# Patient Record
Sex: Male | Born: 1965
Health system: Southern US, Community
[De-identification: ages and names within clinical notes are randomized; demographics above are authoritative.]

## PROBLEM LIST (undated history)

## (undated) DIAGNOSIS — F1910 Other psychoactive substance abuse, uncomplicated: Secondary | ICD-10-CM

## (undated) DIAGNOSIS — K449 Diaphragmatic hernia without obstruction or gangrene: Secondary | ICD-10-CM

## (undated) DIAGNOSIS — I10 Essential (primary) hypertension: Secondary | ICD-10-CM

## (undated) HISTORY — DX: Other psychoactive substance abuse, uncomplicated: F19.10

## (undated) HISTORY — DX: Diaphragmatic hernia without obstruction or gangrene: K44.9

## (undated) HISTORY — PX: NO PAST SURGERIES: SHX2092

---

## 2019-07-09 ENCOUNTER — Other Ambulatory Visit: Payer: Self-pay

## 2019-07-09 ENCOUNTER — Emergency Department (HOSPITAL_COMMUNITY)
Admission: EM | Admit: 2019-07-09 | Discharge: 2019-07-09 | Disposition: A | Discharge status: Home / Self Care | Payer: Self-pay | Attending: Emergency Medicine | Admitting: Emergency Medicine

## 2019-07-09 ENCOUNTER — Encounter (HOSPITAL_COMMUNITY): Payer: Self-pay

## 2019-07-09 DIAGNOSIS — M25561 Pain in right knee: Secondary | ICD-10-CM | POA: Insufficient documentation

## 2019-07-09 DIAGNOSIS — G8929 Other chronic pain: Secondary | ICD-10-CM | POA: Insufficient documentation

## 2019-07-09 DIAGNOSIS — I10 Essential (primary) hypertension: Secondary | ICD-10-CM | POA: Insufficient documentation

## 2019-07-09 HISTORY — DX: Essential (primary) hypertension: I10

## 2019-07-09 MED ORDER — MELOXICAM 7.5 MG PO TABS: 7.5000 mg | ORAL_TABLET | Freq: Every day | ORAL | 0 refills | Status: DC

## 2019-07-09 NOTE — Discharge Instructions (Addendum)
Take mobic once a day with meals.  Do not take other anti-inflammatories at the same time (Advil, Motrin, ibuprofen, naproxen, Aleve). You may supplement with Tylenol if you need further pain control. Use ice packs or heating pads if this helps control your pain. Follow up with the orthopedic doctor listed below.  Return to the ER if you develop numbness, inability to move your leg, fevers and redness, or any new, worsening, or concerning symptoms.

## 2019-07-09 NOTE — ED Triage Notes (Signed)
Pt reports tearing a ligament in his right leg about 2 months ago. Pt reports he was given oxycodone for pain but cannot take that anymore because he is in a rehab program. Pt states that he is new to this area and really just needs a ortho follow-up.

## 2019-07-09 NOTE — ED Provider Notes (Signed)
[facility] DEPT Provider Note   CSN: [id number] Arrival date & time: 07/09/19  1239     History Chief Complaint  Patient presents with  . Leg Pain    Fernando Cook is a 53 y.o. male presenting for evaluation of right knee pain.  Patient states that 4 months ago he tore a ligament in his knee.  Since then, he has had persistent pain.  He was being managed by an orthopedic doctor in [place].  He was on OxyContin, however recently moved to [facility] for rehab program.  He is not allowed to be on narcotics.  He thus has not been taking anything for pain.  He is requesting information for orthopedic follow-up as well as further pain control.  He denies numbness or tingling.  He denies fevers, chills, redness, or swelling.  He has a history of high blood pressure for which he takes medication, no other medical problems.    HPI     Past Medical History:  Diagnosis Date  . Hypertension     There are no problems to display for this patient.   History reviewed. No pertinent surgical history.     History reviewed. No pertinent family history.  Social History   Tobacco Use  . Smoking status: Not on file  Substance Use Topics  . Alcohol use: Not on file  . Drug use: Not on file    Home Medications Prior to Admission medications   Medication Sig Start Date End Date Taking? Authorizing Provider  meloxicam (MOBIC) 7.5 MG tablet Take 1 tablet (7.5 mg total) by mouth daily. 07/09/19   Marvia Troost, PA-C    Allergies    Patient has no allergy information on record.  Review of Systems   Review of Systems  Musculoskeletal: Positive for arthralgias.  Neurological: Negative for numbness.    Physical Exam Updated Vital Signs BP (!) 181/103 (BP Location: Left Arm)   Pulse 60   Temp 98.3 F (36.8 C) (Oral)   Resp 18   Ht 5\' 7"  (1.702 m)   Wt 96.6 kg   SpO2 100%   BMI 33.36 kg/m   Physical Exam Vitals and nursing note reviewed.   Constitutional:      General: He is not in acute distress.    Appearance: He is well-developed.     Comments: Sitting comfortably in the bed in no acute distress  HENT:     Head: Normocephalic and atraumatic.  Pulmonary:     Effort: Pulmonary effort is normal.  Abdominal:     General: There is no distension.  Musculoskeletal:        General: No swelling or tenderness. Normal range of motion.     Cervical back: Normal range of motion.     Comments: No erythema, warmth, or obvious deformity of the right knee.  No tenderness palpation over the joint line.  Full active range of motion of the knee without difficulty.  Good distal sensation.  Pedal pulses intact.  Skin:    General: Skin is warm.     Capillary Refill: Capillary refill takes less than 2 seconds.     Findings: No rash.  Neurological:     Mental Status: He is alert and oriented to person, place, and time.     ED Results / Procedures / Treatments   Labs (all labs ordered are listed, but only abnormal results are displayed) Labs Reviewed - No data to display  EKG None  Radiology No results found.  Procedures Procedures (including critical care time)  Medications Ordered in ED Medications - No data to display  ED Course  I have reviewed the triage vital signs and the nursing notes.  Pertinent labs & imaging results that were available during my care of the patient were reviewed by me and considered in my medical decision making (see chart for details).    MDM Rules/Calculators/A&P                      Patient presenting for evaluation of knee pain.  He is requesting information for follow-up with orthopedics.  No sign of deformity, no joint line tenderness.  I do not believe he needs repeat x-rays today.  Discussed symptomatic treatment with NSAIDs, will give Mobic for pain control.  Encouraged ice as needed.  Patient given information for follow-up with orthopedics.  At this time, patient appears safe for  discharge.  Return precautions given.  Patient states he understands and agrees to plan.  Final Clinical Impression(s) / ED Diagnoses Final diagnoses:  Chronic pain of right knee    Rx / DC Orders ED Discharge Orders         Ordered    meloxicam (MOBIC) 7.5 MG tablet  Daily     07/09/19 1313           [place], PA-C 07/09/19 1351    Charlesetta Shanks, MD 07/10/19 1243

## 2019-07-15 ENCOUNTER — Encounter (HOSPITAL_COMMUNITY): Payer: Self-pay

## 2019-07-15 ENCOUNTER — Emergency Department (HOSPITAL_COMMUNITY)
Admission: EM | Admit: 2019-07-15 | Discharge: 2019-07-15 | Disposition: A | Discharge status: Home / Self Care | Payer: Self-pay | Attending: Emergency Medicine | Admitting: Emergency Medicine

## 2019-07-15 ENCOUNTER — Other Ambulatory Visit: Payer: Self-pay

## 2019-07-15 DIAGNOSIS — I10 Essential (primary) hypertension: Secondary | ICD-10-CM | POA: Insufficient documentation

## 2019-07-15 DIAGNOSIS — R1013 Epigastric pain: Secondary | ICD-10-CM | POA: Insufficient documentation

## 2019-07-15 DIAGNOSIS — Z760 Encounter for issue of repeat prescription: Secondary | ICD-10-CM | POA: Insufficient documentation

## 2019-07-15 MED ORDER — FAMOTIDINE 20 MG PO TABS
20.0000 mg | ORAL_TABLET | Freq: Once | ORAL | Status: AC
  Administered 2019-07-15: 20 mg via ORAL
  Filled 2019-07-15: qty 1

## 2019-07-15 MED ORDER — ALUM & MAG HYDROXIDE-SIMETH 200-200-20 MG/5ML PO SUSP
30.0000 mL | Freq: Once | ORAL | Status: AC
  Administered 2019-07-15: 30 mL via ORAL
  Filled 2019-07-15: qty 30

## 2019-07-15 MED ORDER — OMEPRAZOLE 20 MG PO CPDR: 20.0000 mg | DELAYED_RELEASE_CAPSULE | Freq: Every day | ORAL | 1 refills | Status: DC

## 2019-07-15 MED ORDER — LIDOCAINE VISCOUS HCL 2 % MT SOLN
15.0000 mL | Freq: Once | OROMUCOSAL | Status: AC
  Administered 2019-07-15: 15 mL via ORAL
  Filled 2019-07-15: qty 15

## 2019-07-15 MED ORDER — FAMOTIDINE 20 MG PO TABS: 20.0000 mg | ORAL_TABLET | Freq: Two times a day (BID) | ORAL | 0 refills | Status: DC

## 2019-07-15 NOTE — Discharge Instructions (Signed)
It is very important that you follow-up with one of her gastroenterologist.  You may have ulcers or other conditions in your stomach causing his persistent symptoms.  You need to see a specialist for this, who can perform procedure to look at your stomach with a camera.  In the meantime, try to follow the food diet provided in the information.  Try to avoid coffee, soda, very spicy food or hot sauce.

## 2019-07-15 NOTE — ED Provider Notes (Signed)
[facility] DEPT Provider Note   CSN: [id number] Arrival date & time: 07/15/19  1011     History Chief Complaint  Patient presents with  . Abdominal Pain  . Medication Refill    Fernando Cook is a 53 y.o. male the history of chronic epigastric pain and reflux presenting to the ED with recurrent epigastric pain.  He reports he has had the exact same pain for 10 years please getting worse past several days.  Says he recently moved from [place] and ran out of his prescription of omeprazole, and needs a refill.  He denies any vomiting.  He says pain is burning and located directly in his epigastrium.  Does not radiate anywhere.  It is 9 out of 10 right now.  Does not have any diarrhea.  He has no history of abdominal surgeries.  No fevers or chills.  HPI     Past Medical History:  Diagnosis Date  . Hypertension     There are no problems to display for this patient.   History reviewed. No pertinent surgical history.     History reviewed. No pertinent family history.  Social History   Tobacco Use  . Smoking status: Not on file  Substance Use Topics  . Alcohol use: Not on file  . Drug use: Not on file    Home Medications Prior to Admission medications   Medication Sig Start Date End Date Taking? Authorizing Provider  famotidine (PEPCID) 20 MG tablet Take 1 tablet (20 mg total) by mouth 2 (two) times daily. 07/15/19 08/14/19  Wyvonnia Dusky, MD  meloxicam (MOBIC) 7.5 MG tablet Take 1 tablet (7.5 mg total) by mouth daily. 07/09/19   Caccavale, Sophia, PA-C  omeprazole (PRILOSEC) 20 MG capsule Take 1 capsule (20 mg total) by mouth daily. 07/15/19 11/12/19  Wyvonnia Dusky, MD    Allergies    Patient has no known allergies.  Review of Systems   Review of Systems  Constitutional: Negative for chills and fever.  Respiratory: Negative for cough and shortness of breath.   Cardiovascular: Negative for chest pain and palpitations.   Gastrointestinal: Positive for abdominal pain and nausea. Negative for constipation, diarrhea and vomiting.  Musculoskeletal: Negative for arthralgias and back pain.  Skin: Negative for pallor and rash.  Neurological: Negative for syncope and light-headedness.  All other systems reviewed and are negative.   Physical Exam Updated Vital Signs BP (!) 141/85   Pulse 74   Temp 98 F (36.7 C) (Oral)   Resp 15   SpO2 97%   Physical Exam Vitals and nursing note reviewed.  Constitutional:      Appearance: He is well-developed.  HENT:     Head: Normocephalic and atraumatic.  Eyes:     Conjunctiva/sclera: Conjunctivae normal.  Cardiovascular:     Rate and Rhythm: Normal rate and regular rhythm.     Heart sounds: No murmur.  Pulmonary:     Effort: Pulmonary effort is normal. No respiratory distress.     Breath sounds: Normal breath sounds.  Abdominal:     General: There is no distension.     Palpations: Abdomen is soft.     Tenderness: There is abdominal tenderness (mild) in the epigastric area. There is no right CVA tenderness, left CVA tenderness, guarding or rebound. Negative signs include Murphy's sign and McBurney's sign.  Musculoskeletal:     Cervical back: Neck supple.  Skin:    General: Skin is warm and dry.  Neurological:  General: No focal deficit present.     Mental Status: He is alert and oriented to person, place, and time.     ED Results / Procedures / Treatments   Labs (all labs ordered are listed, but only abnormal results are displayed) Labs Reviewed - No data to display  EKG None  Radiology No results found.  Procedures Procedures (including critical care time)  Medications Ordered in ED Medications  alum & mag hydroxide-simeth (MAALOX/MYLANTA) 200-200-20 MG/5ML suspension 30 mL (30 mLs Oral Given 07/15/19 1044)    And  lidocaine (XYLOCAINE) 2 % viscous mouth solution 15 mL (15 mLs Oral Given 07/15/19 1044)  famotidine (PEPCID) tablet 20 mg  (20 mg Oral Given 07/15/19 1043)    ED Course  I have reviewed the triage vital signs and the nursing notes.  Pertinent labs & imaging results that were available during my care of the patient were reviewed by me and considered in my medical decision making (see chart for details).  53 year old male presents to the ED with very mild epigastric pain and discomfort and burning sensation.  Has a history of reflux.  May have peptic ulcer disease.  Very low suspicion for perforation.  He has benign abdominal exam no distention.  He ran out of his medications will have to refill these.  Also explained he needs to follow-up with a gastroenterologist, he is new to the area but says he will try to do so.  We will give him a GI cocktail here.  I also very low suspicion for biliary disease.  He has no fever no right upper quadrant tenderness and negative Murphy sign.  Likewise of a low suspicion for pancreatitis.  He reports he has had no recent alcohol consumption or binge drinking.  No prior history of pancreatitis.  Likewise have a low suspicion this is an atypical cardiac syndrome, given that the patient tells me has had the same kind of pain in his epigastrium for over 10 years.  Clinical Course as of Jul 14 1456  Fri Jul 15, 2019  1109 Pain resolved after GI cocktail   [MT]    Clinical Course User Index [MT] Langston Masker Carola Rhine, MD     Final Clinical Impression(s) / ED Diagnoses Final diagnoses:  Epigastric pain    Rx / DC Orders ED Discharge Orders         Ordered    famotidine (PEPCID) 20 MG tablet  2 times daily     07/15/19 1104    omeprazole (PRILOSEC) 20 MG capsule  Daily     07/15/19 1104           Wyvonnia Dusky, MD 07/15/19 1457

## 2019-07-15 NOTE — ED Notes (Signed)
Pt ambulatory from triage 

## 2019-07-15 NOTE — ED Triage Notes (Signed)
Pt c/o ulcers and epigastric pain. Pt states he recently ran out of omeprezole .

## 2019-07-18 ENCOUNTER — Encounter: Payer: Self-pay | Admitting: Nurse Practitioner

## 2019-08-05 ENCOUNTER — Ambulatory Visit (INDEPENDENT_AMBULATORY_CARE_PROVIDER_SITE_OTHER): Payer: Self-pay | Admitting: Nurse Practitioner

## 2019-08-05 ENCOUNTER — Encounter: Payer: Self-pay | Admitting: Nurse Practitioner

## 2019-08-05 ENCOUNTER — Other Ambulatory Visit (INDEPENDENT_AMBULATORY_CARE_PROVIDER_SITE_OTHER): Payer: Self-pay

## 2019-08-05 VITALS — BP 122/86 | HR 74 | Temp 98.1°F | Ht 67.0 in | Wt 239.2 lb

## 2019-08-05 DIAGNOSIS — G8929 Other chronic pain: Secondary | ICD-10-CM

## 2019-08-05 DIAGNOSIS — Z1211 Encounter for screening for malignant neoplasm of colon: Secondary | ICD-10-CM

## 2019-08-05 DIAGNOSIS — Z01818 Encounter for other preprocedural examination: Secondary | ICD-10-CM

## 2019-08-05 DIAGNOSIS — R1013 Epigastric pain: Secondary | ICD-10-CM

## 2019-08-05 LAB — HEPATIC FUNCTION PANEL
ALT: 13 U/L (ref 0–53)
AST: 17 U/L (ref 0–37)
Albumin: 4.1 g/dL (ref 3.5–5.2)
Alkaline Phosphatase: 72 U/L (ref 39–117)
Bilirubin, Direct: 0.1 mg/dL (ref 0.0–0.3)
Total Bilirubin: 0.6 mg/dL (ref 0.2–1.2)
Total Protein: 7.4 g/dL (ref 6.0–8.3)

## 2019-08-05 LAB — CBC
HCT: 39 % (ref 39.0–52.0)
Hemoglobin: 12.7 g/dL — ABNORMAL LOW (ref 13.0–17.0)
MCHC: 32.6 g/dL (ref 30.0–36.0)
MCV: 81.6 fl (ref 78.0–100.0)
Platelets: 287 10*3/uL (ref 150.0–400.0)
RBC: 4.77 Mil/uL (ref 4.22–5.81)
RDW: 14.4 % (ref 11.5–15.5)
WBC: 5.9 10*3/uL (ref 4.0–10.5)

## 2019-08-05 LAB — LIPASE: Lipase: 13 U/L (ref 11.0–59.0)

## 2019-08-05 MED ORDER — NA SULFATE-K SULFATE-MG SULF 17.5-3.13-1.6 GM/177ML PO SOLN: ORAL | 0 refills | Status: DC

## 2019-08-05 NOTE — Patient Instructions (Addendum)
If you are age 56 or older, your body mass index should be between 23-30. Your Body mass index is 37.47 kg/m. If this is out of the aforementioned range listed, please consider follow up with your Primary Care Provider.  If you are age 22 or younger, your body mass index should be between 19-25. Your Body mass index is 37.47 kg/m. If this is out of the aformentioned range listed, please consider follow up with your Primary Care Provider.   You have been scheduled for an endoscopy and colonoscopy. Please follow written instructions given to you at your visit today.  Please pick up your prep supplies at the pharmacy within the next 1-3 days. If you use inhalers (even only as needed), please bring them with you on the day of your procedure. Your physician has requested that you go to www.startemmi.com and enter the access code given to you at your visit today. This web site gives a general overview about your procedure. However, you should still follow specific instructions given to you by our office regarding your preparation for the procedure.  We have sent the following medications to your pharmacy for you to pick up at your convenience: [facility] provider has requested that you go to the basement level for lab work before leaving today. Press "B" on the elevator. The lab is located at the first door on the left as you exit the elevator.  STOP MOBIC.  Continue Omeprazole daily.  Thank you for choosing me and [facility].   Tye Savoy, NP

## 2019-08-05 NOTE — Progress Notes (Signed)
ASSESSMENT / PLAN:   54 year old male with pmh significant for hypertension  1.  Epigastric pain, chronic intermittent. Limited historian but sounds like he is for the most part compliant with PPi but still has intermittent pain. Seen in ED late December for pain, no labs or imaging done.  Rule out PUD, doubt biliary --For further evaluation of epigastric pain patient will be scheduled for EGD. The risks and benefits of EGD were discussed and the patient agrees to proceed.  --Discontinue meloxicam --Continue daily omeprazole --Obtain basic labs including CBC, lipase and liver tests  2.  Colon cancer screening.  No known family history colon cancer.  No concerning signs or symptoms. -We discussed colonoscopy which I offered to be done at time of EGD.The risks and benefits of colonoscopy with possible polypectomy / biopsies were discussed and the patient agrees to proceed.    HPI:    Referring Provider:     Self  Reason for referral:    Abdominal pain  Chief Complaint:   Upper abdominal pain  Fernando Cook is a 54 year old male, lilmited historian who gives a history of chronic intermittent epigastric pain.  He describes the pain as sharp, nonradiating.  He has apparently taken Nexium for years but admits to skipping doses.  He has learned to avoid spicy foods.  Despite taking PPI most days and avoiding spicy foods he still has frequent episodes of this sharp upper abdominal pain.  Denies nausea, vomiting, or weight loss.  No bowel changes.  Patient does not drink alcohol.  Weight stable.  He was seen in the emergency department for evaluation of epigastric pain 07/15/2019. In the ED he was given viscous lidocaine, Maalox, and Pepcid.  Labs, x-ray or EKG performed.   Past Medical History:  Diagnosis Date  . Hypertension      Past Surgical History:  Procedure Laterality Date  . NO PAST SURGERIES     Family History  Problem Relation Age of Onset  . Hypertension  Mother   . Hyperlipidemia Mother   . Prostate cancer Father   . Esophageal cancer Neg Hx   . Colon cancer Neg Hx   . Stomach cancer Neg Hx   . Pancreatic cancer Neg Hx    Social History   Tobacco Use  . Smoking status: Never Smoker  . Smokeless tobacco: Never Used  Substance Use Topics  . Alcohol use: Not Currently  . Drug use: Not Currently   Current Outpatient Medications  Medication Sig Dispense Refill  . meloxicam (MOBIC) 7.5 MG tablet Take 1 tablet (7.5 mg total) by mouth daily. 30 tablet 0  . omeprazole (PRILOSEC) 20 MG capsule Take 1 capsule (20 mg total) by mouth daily. 60 capsule 1   No current facility-administered medications for this visit.   No Known Allergies   Review of Systems: All systems reviewed and negative except where noted in HPI.   Physical Exam:    Wt Readings from Last 3 Encounters:  08/05/19 239 lb 4 oz (108.5 kg)  07/09/19 213 lb (96.6 kg)    BP 122/86 (BP Location: Left Arm, Patient Position: Sitting, Cuff Size: Large)   Pulse 74   Temp 98.1 F (36.7 C)   Ht 5\' 7"  (1.702 m)   Wt 239 lb 4 oz (108.5 kg)   SpO2 96%   BMI 37.47 kg/m  Constitutional:  Pleasant  male in no acute distress. Psychiatric: Normal  mood and affect. Behavior is normal. EENT: Pupils normal.  Conjunctivae are normal. No scleral icterus. Neck supple.  Cardiovascular: Normal rate, regular rhythm. No edema Pulmonary/chest: Effort normal and breath sounds normal. No wheezing, rales or rhonchi. Abdominal: Soft, nondistended, nontender. Bowel sounds active throughout. There are no masses palpable. No hepatomegaly. Neurological: Alert and oriented to person place and time. Skin: Skin is warm and dry. No rashes noted.  Tye Savoy, NP  08/05/2019, 9:41 AM

## 2019-08-09 NOTE — Progress Notes (Signed)
Reviewed and agree with documentation and assessment and plan. K. Veena Rami Waddle , MD   

## 2019-08-11 ENCOUNTER — Ambulatory Visit (INDEPENDENT_AMBULATORY_CARE_PROVIDER_SITE_OTHER): Payer: Self-pay

## 2019-08-11 DIAGNOSIS — Z1159 Encounter for screening for other viral diseases: Secondary | ICD-10-CM

## 2019-08-12 LAB — SARS CORONAVIRUS 2 (TAT 6-24 HRS): SARS Coronavirus 2: NEGATIVE

## 2019-08-15 ENCOUNTER — Other Ambulatory Visit: Payer: Self-pay

## 2019-08-15 ENCOUNTER — Encounter: Payer: Self-pay | Admitting: Gastroenterology

## 2019-08-15 ENCOUNTER — Ambulatory Visit (AMBULATORY_SURGERY_CENTER): Payer: Self-pay | Admitting: Gastroenterology

## 2019-08-15 VITALS — BP 122/83 | HR 78 | Temp 97.7°F | Resp 15 | Ht 67.0 in | Wt 239.0 lb

## 2019-08-15 DIAGNOSIS — Z538 Procedure and treatment not carried out for other reasons: Secondary | ICD-10-CM

## 2019-08-15 DIAGNOSIS — K259 Gastric ulcer, unspecified as acute or chronic, without hemorrhage or perforation: Secondary | ICD-10-CM

## 2019-08-15 DIAGNOSIS — K209 Esophagitis, unspecified without bleeding: Secondary | ICD-10-CM

## 2019-08-15 DIAGNOSIS — K449 Diaphragmatic hernia without obstruction or gangrene: Secondary | ICD-10-CM

## 2019-08-15 DIAGNOSIS — G8929 Other chronic pain: Secondary | ICD-10-CM

## 2019-08-15 DIAGNOSIS — Z1211 Encounter for screening for malignant neoplasm of colon: Secondary | ICD-10-CM

## 2019-08-15 MED ORDER — SUCRALFATE 1 G PO TABS: 1.0000 g | ORAL_TABLET | Freq: Three times a day (TID) | ORAL | 1 refills | Status: DC

## 2019-08-15 MED ORDER — PANTOPRAZOLE SODIUM 40 MG PO TBEC: 40.0000 mg | DELAYED_RELEASE_TABLET | Freq: Two times a day (BID) | ORAL | 3 refills | Status: DC

## 2019-08-15 MED ORDER — SODIUM CHLORIDE 0.9 % IV SOLN: 500.0000 mL | Freq: Once | INTRAVENOUS | Status: DC

## 2019-08-15 NOTE — Progress Notes (Signed)
Repeat colonoscopy and EGD no scheduled, as schedule not available 3 months out at this time.

## 2019-08-15 NOTE — Progress Notes (Signed)
VS by DT. Temp by LC 

## 2019-08-15 NOTE — Op Note (Addendum)
[facility] Patient Name: Fernando Cook Procedure Date: 08/15/2019 3:19 PM MRN: [id number] Endoscopist: Mauri Pole , MD Age: 54 Referring MD:  Date of Birth: 1966-01-29 Gender: Male Account #: [id number] Procedure:                Upper GI endoscopy Indications:              Epigastric abdominal pain Medicines:                Monitored Anesthesia Care Procedure:                Pre-Anesthesia Assessment:                           - Prior to the procedure, a History and Physical                            was performed, and patient medications and                            allergies were reviewed. The patient's tolerance of                            previous anesthesia was also reviewed. The risks                            and benefits of the procedure and the sedation                            options and risks were discussed with the patient.                            All questions were answered, and informed consent                            was obtained. Prior Anticoagulants: The patient has                            taken no previous anticoagulant or antiplatelet                            agents. ASA Grade Assessment: II - A patient with                            mild systemic disease. After reviewing the risks                            and benefits, the patient was deemed in                            satisfactory condition to undergo the procedure.                           After obtaining informed consent, the endoscope was  passed under direct vision. Throughout the                            procedure, the patient's blood pressure, pulse, and                            oxygen saturations were monitored continuously. The                            Endoscope was introduced through the mouth, and                            advanced to the second part of duodenum. The upper                            GI endoscopy was  accomplished without difficulty.                            The patient tolerated the procedure well. Scope In: Scope Out: Findings:                 Three cratered and superficial esophageal ulcers                            were found 37 to 38 cm from the incisors. The                            largest lesion was 2 mm in largest dimension.                           LA Grade D (one or more mucosal breaks involving at                            least 75% of esophageal circumference) esophagitis                            was found 36 to 38 cm from the incisors.                           A small hiatal hernia was present.                           Patchy mild inflammation characterized by                            congestion (edema) and erythema was found in the                            entire examined stomach. Biopsies were taken with a                            cold forceps for Helicobacter pylori testing.  One non-bleeding cratered gastric ulcer with a                            clean ulcer base (Forrest Class III) was found in                            the prepyloric region of the stomach. The lesion                            was 6 mm in largest dimension. Biopsies were taken                            with a cold forceps for histology.                           The examined duodenum was normal. Complications:            No immediate complications. Estimated Blood Loss:     Estimated blood loss was minimal. Impression:               - Esophageal ulcers.                           - LA Grade D reflux esophagitis.                           - Small hiatal hernia.                           - Gastritis. Biopsied.                           - Non-bleeding gastric ulcer with a clean ulcer                            base (Forrest Class III). Biopsied.                           - Normal examined duodenum. Recommendation:           - Resume previous diet.                            - Continue present medications.                           - Await pathology results.                           - No aspirin, ibuprofen, naproxen, or other                            non-steroidal anti-inflammatory drugs.                           - Repeat upper endoscopy in 3 months for  surveillance based on pathology results.                           - Return to GI clinic in 2 months.                           - Use Protonix (pantoprazole) 40 mg PO BID for 3                            months.                           - Use sucralfate tablets 1 gram PO QID for 1 month. Mauri Pole, MD 08/15/2019 3:51:33 PM This report has been signed electronically.

## 2019-08-15 NOTE — Op Note (Signed)
[facility] Patient Name: Fernando Cook Procedure Date: 08/15/2019 3:18 PM MRN: [id number] Endoscopist: Mauri Pole , MD Age: 54 Referring MD:  Date of Birth: [birth year] Gender: Male Account #: [id number] Procedure:                Colonoscopy Indications:              Screening for colorectal malignant neoplasm Medicines:                Monitored Anesthesia Care Procedure:                Pre-Anesthesia Assessment:                           - Prior to the procedure, a History and Physical                            was performed, and patient medications and                            allergies were reviewed. The patient's tolerance of                            previous anesthesia was also reviewed. The risks                            and benefits of the procedure and the sedation                            options and risks were discussed with the patient.                            All questions were answered, and informed consent                            was obtained. Prior Anticoagulants: The patient has                            taken no previous anticoagulant or antiplatelet                            agents. ASA Grade Assessment: II - A patient with                            mild systemic disease. After reviewing the risks                            and benefits, the patient was deemed in                            satisfactory condition to undergo the procedure.                           After obtaining informed consent, the colonoscope  was passed under direct vision. Throughout the                            procedure, the patient's blood pressure, pulse, and                            oxygen saturations were monitored continuously. The                            Colonoscope was introduced through the anus and                            advanced to the the cecum, identified by                            appendiceal orifice  and ileocecal valve. The                            colonoscopy was performed without difficulty. The                            patient tolerated the procedure well. The quality                            of the bowel preparation was not adequate to                            identify polyps 6 mm and larger in size. The                            ileocecal valve, appendiceal orifice, and rectum                            were photographed. Scope In: 3:38:58 PM Scope Out: 3:45:32 PM Scope Withdrawal Time: 0 hours 2 minutes 36 seconds  Total Procedure Duration: 0 hours 6 minutes 34 seconds  Findings:                 The perianal and digital rectal examinations were                            normal.                           Semi-solid stool was found in the descending colon,                            in the transverse colon, in the ascending colon and                            in the cecum, interfering with visualization.                            Lavage of the area was performed, resulting in  incomplete clearance with fair visualization. Complications:            No immediate complications. Estimated Blood Loss:     Estimated blood loss: none. Impression:               - Preparation of the colon was inadequate.                           - Stool in the descending colon, in the transverse                            colon, in the ascending colon and in the cecum.                           - No specimens collected. Recommendation:           - Patient has a contact number available for                            emergencies. The signs and symptoms of potential                            delayed complications were discussed with the                            patient. Return to normal activities tomorrow.                            Written discharge instructions were provided to the                            patient.                           - Resume previous  diet.                           - Continue present medications.                           - Repeat colonoscopy in 3 months because the bowel                            preparation was suboptimal. Mauri Pole, MD 08/15/2019 3:53:43 PM This report has been signed electronically.

## 2019-08-15 NOTE — Progress Notes (Signed)
Patient is a poor historian. Said he ate chicken, chips and dip yesterday while watching football. Had orange juice this am before 11. Says he finished prep and stool is clear yellow. Dr. Silverio Decamp notified, she said she will proceed with upper and attempt to do lower, but may have to reschedule if colon not empty.

## 2019-08-15 NOTE — Progress Notes (Signed)
Called to room to assist during endoscopic procedure.  Patient ID and intended procedure confirmed with present staff. Received instructions for my participation in the procedure from the performing physician.  

## 2019-08-15 NOTE — Patient Instructions (Signed)
Impression/Recommendations:  Esophagitis handout given to patient. Gastritis handout given to patient.  Resume previous diet. Continue present medications. Await pathology results.  No aspirin, ibuprofen, naproxen, or other NSAID drugs.  Repeat upper endoscopy in 3 months for surveillance based on pathology results. Repeat colonoscopy in 3 months, as bowel preparation was suboptimal.  Return to GI clinic in 2 months.  YOU HAD AN ENDOSCOPIC PROCEDURE TODAY AT [facility]:   Refer to the procedure report that was given to you for any specific questions about what was found during the examination.  If the procedure report does not answer your questions, please call your gastroenterologist to clarify.  If you requested that your care partner not be given the details of your procedure findings, then the procedure report has been included in a sealed envelope for you to review at your convenience later.  YOU SHOULD EXPECT: Some feelings of bloating in the abdomen. Passage of more gas than usual.  Walking can help get rid of the air that was put into your GI tract during the procedure and reduce the bloating. If you had a lower endoscopy (such as a colonoscopy or flexible sigmoidoscopy) you may notice spotting of blood in your stool or on the toilet paper. If you underwent a bowel prep for your procedure, you may not have a normal bowel movement for a few days.  Please Note:  You might notice some irritation and congestion in your nose or some drainage.  This is from the oxygen used during your procedure.  There is no need for concern and it should clear up in a day or so.  SYMPTOMS TO REPORT IMMEDIATELY:   Following lower endoscopy (colonoscopy or flexible sigmoidoscopy):  Excessive amounts of blood in the stool  Significant tenderness or worsening of abdominal pains  Swelling of the abdomen that is new, acute  Fever of 100F or higher   Following upper endoscopy  (EGD)  Vomiting of blood or coffee ground material  New chest pain or pain under the shoulder blades  Painful or persistently difficult swallowing  New shortness of breath  Fever of 100F or higher  Black, tarry-looking stools  For urgent or emergent issues, a gastroenterologist can be reached at any hour by calling [phone].   DIET:  We do recommend a small meal at first, but then you may proceed to your regular diet.  Drink plenty of fluids but you should avoid alcoholic beverages for 24 hours.  ACTIVITY:  You should plan to take it easy for the rest of today and you should NOT DRIVE or use heavy machinery until tomorrow (because of the sedation medicines used during the test).    FOLLOW UP: Our staff will call the number listed on your records 48-72 hours following your procedure to check on you and address any questions or concerns that you may have regarding the information given to you following your procedure. If we do not reach you, we will leave a message.  We will attempt to reach you two times.  During this call, we will ask if you have developed any symptoms of COVID 19. If you develop any symptoms (ie: fever, flu-like symptoms, shortness of breath, cough etc.) before then, please call [phone].  If you test positive for Covid 19 in the 2 weeks post procedure, please call and report this information to [place].    If any biopsies were taken you will be contacted by phone or by letter within the next  1-3 weeks.  Please call us at [phone] if you have not heard about the biopsies in 3 weeks.    SIGNATURES/CONFIDENTIALITY: You and/or your care partner have signed paperwork which will be entered into your electronic medical record.  These signatures attest to the fact that that the information above on your After Visit Summary has been reviewed and is understood.  Full responsibility of the confidentiality of this discharge information lies with you and/or your  care-partner.

## 2019-08-15 NOTE — Progress Notes (Signed)
To PACU VSS. Report to [email or web address]

## 2019-08-17 ENCOUNTER — Telehealth: Payer: Self-pay | Admitting: *Deleted

## 2019-08-17 ENCOUNTER — Other Ambulatory Visit: Payer: Self-pay

## 2019-08-17 ENCOUNTER — Telehealth: Payer: Self-pay

## 2019-08-17 NOTE — Telephone Encounter (Signed)
1st follow up call made.  NALM 

## 2019-08-17 NOTE — Telephone Encounter (Signed)
Message left

## 2019-08-24 ENCOUNTER — Encounter: Payer: Self-pay | Admitting: Gastroenterology

## 2019-08-27 ENCOUNTER — Emergency Department (HOSPITAL_COMMUNITY)
Admission: EM | Admit: 2019-08-27 | Discharge: 2019-08-27 | Disposition: A | Discharge status: Home / Self Care | Payer: Self-pay | Attending: Emergency Medicine | Admitting: Emergency Medicine

## 2019-08-27 ENCOUNTER — Other Ambulatory Visit: Payer: Self-pay

## 2019-08-27 DIAGNOSIS — Z760 Encounter for issue of repeat prescription: Secondary | ICD-10-CM | POA: Insufficient documentation

## 2019-08-27 DIAGNOSIS — Z79899 Other long term (current) drug therapy: Secondary | ICD-10-CM | POA: Insufficient documentation

## 2019-08-27 DIAGNOSIS — I10 Essential (primary) hypertension: Secondary | ICD-10-CM | POA: Insufficient documentation

## 2019-08-27 DIAGNOSIS — M25531 Pain in right wrist: Secondary | ICD-10-CM | POA: Insufficient documentation

## 2019-08-27 MED ORDER — LOSARTAN POTASSIUM-HCTZ 50-12.5 MG PO TABS: 1.0000 | ORAL_TABLET | Freq: Every day | ORAL | 1 refills | Status: DC

## 2019-08-27 NOTE — Discharge Instructions (Addendum)
Please return for any problem.  Follow-up with a primary care provider and orthopedics as instructed.

## 2019-08-27 NOTE — ED Provider Notes (Signed)
[facility] DEPT Provider Note   CSN: [id number] Arrival date & time: 08/27/19  1016     History Chief Complaint  Patient presents with  . Hypertension  . right wrist pain    Fernando Cook is a 55 y.o. male.  54 year old male with prior medical history as detailed below presents with assorted minor complaints.  Patient request a refill on his blood pressure medication.  He also is requesting a Velcro wrist splint for his right wrist.  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  He does not have any routine primary medical care.  He is otherwise without specific acute complaint.  The history is provided by the patient and medical records.  Illness Location:  Medication refill, wrist splint, PMP referral  Severity:  Mild Onset quality:  Unable to specify Timing:  Constant Progression:  Waxing and waning Chronicity:  Chronic      Past Medical History:  Diagnosis Date  . Hypertension   . Substance abuse ([place])    ETOH/ stopped drinking nov. 2020    There are no problems to display for this patient.   Past Surgical History:  Procedure Laterality Date  . NO PAST SURGERIES         Family History  Problem Relation Age of Onset  . Hypertension Mother   . Hyperlipidemia Mother   . Prostate cancer Father   . Esophageal cancer Neg Hx   . Colon cancer Neg Hx   . Stomach cancer Neg Hx   . Pancreatic cancer Neg Hx     Social History   Tobacco Use  . Smoking status: Never Smoker  . Smokeless tobacco: Never Used  Substance Use Topics  . Alcohol use: Not Currently  . Drug use: Not Currently    Home Medications Prior to Admission medications   Medication Sig Start Date End Date Taking? Authorizing Provider  losartan-hydrochlorothiazide (HYZAAR) 50-12.5 MG tablet Take 1 tablet by mouth daily. 08/27/19   Valarie Merino, MD  meloxicam (MOBIC) 7.5 MG tablet Take 1 tablet (7.5  mg total) by mouth daily. 07/09/19   Caccavale, Sophia, PA-C  omeprazole (PRILOSEC) 20 MG capsule Take 1 capsule (20 mg total) by mouth daily. 07/15/19 11/12/19  Wyvonnia Dusky, MD  pantoprazole (PROTONIX) 40 MG tablet Take 1 tablet (40 mg total) by mouth 2 (two) times daily before a meal. 08/15/19   Nandigam, Venia Minks, MD  sucralfate (CARAFATE) 1 g tablet Take 1 tablet (1 g total) by mouth 4 (four) times daily -  with meals and at bedtime. 08/15/19   Mauri Pole, MD    Allergies    Patient has no known allergies.  Review of Systems   Review of Systems  All other systems reviewed and are negative.   Physical Exam Updated Vital Signs BP 136/82 (BP Location: Left Arm)   Pulse 77   Temp 98.5 F (36.9 C) (Oral)   Resp 16   Ht 5\' 7"  (1.702 m)   Wt 108.4 kg   SpO2 98%   BMI 37.43 kg/m   Physical Exam Vitals and nursing note reviewed.  Constitutional:      General: He is not in acute distress.    Appearance: He is well-developed.  HENT:     Head: Normocephalic and atraumatic.  Eyes:     Conjunctiva/sclera: Conjunctivae normal.     Pupils: Pupils are equal, round, and reactive to light.  Cardiovascular:  Rate and Rhythm: Normal rate and regular rhythm.     Heart sounds: Normal heart sounds.  Pulmonary:     Effort: Pulmonary effort is normal. No respiratory distress.     Breath sounds: Normal breath sounds.  Abdominal:     General: There is no distension.     Palpations: Abdomen is soft.     Tenderness: There is no abdominal tenderness.  Musculoskeletal:        General: No deformity. Normal range of motion.     Cervical back: Normal range of motion and neck supple.  Skin:    General: Skin is warm and dry.  Neurological:     Mental Status: He is alert and oriented to person, place, and time.     ED Results / Procedures / Treatments   Labs (all labs ordered are listed, but only abnormal results are displayed) Labs Reviewed - No data to display  EKG None   Radiology No results found.  Procedures Procedures (including critical care time)  Medications Ordered in ED Medications - No data to display  ED Course  I have reviewed the triage vital signs and the nursing notes.  Pertinent labs & imaging results that were available during my care of the patient were reviewed by me and considered in my medical decision making (see chart for details).    MDM Rules/Calculators/A&P                      MDM  Screen complete  Fernando Cook was evaluated in Emergency Department on 08/27/2019 for the symptoms described in the history of present illness. He was evaluated in the context of the global COVID-19 pandemic, which necessitated consideration that the patient might be at risk for infection with the SARS-CoV-2 virus that causes COVID-19. Institutional protocols and algorithms that pertain to the evaluation of patients at risk for COVID-19 are in a state of rapid change based on information released by regulatory bodies including the CDC and federal and state organizations. These policies and algorithms were followed during the patient's care in the ED.   Patient is presenting with a request from refill on his longstanding blood pressure medication.  He also requested a Velcro wrist splint.  He is otherwise without specific acute complaint.  He is strongly encouraged to obtain primary medical care.  Importance of close follow-up is stressed.  Strict return precautions given and understood.   Final Clinical Impression(s) / ED Diagnoses Final diagnoses:  Medication refill    Rx / DC Orders ED Discharge Orders         Ordered    losartan-hydrochlorothiazide (HYZAAR) 50-12.5 MG tablet  Daily     08/27/19 1106           Valarie Merino, MD 08/27/19 1112

## 2019-08-27 NOTE — ED Notes (Signed)
ED Provider at bedside. 

## 2019-08-27 NOTE — ED Triage Notes (Addendum)
Patient states his blood pressure has been too high lately and his current medication isn't working. Patient states "I am hoping to get a different type of blood pressure medication today" Patient also displays a swollen right wrist, says pain hurts 9/10. Patient states he injured his wrist while working at a [employer] a few months ago. Patient says he would like a splint for his wrist. Patient says the swelling "goes up and then it goes down"

## 2019-09-07 NOTE — Progress Notes (Signed)
Patient ID: Fernando Cook, male   DOB: 05/07/1966, 54 y.o.   MRN: [id number]     Virtual Visit via Telephone Note  I connected with Desiree Hane on 09/08/19 at  9:10 AM EST by telephone and verified that I am speaking with the correct person using two identifiers.   I discussed the limitations, risks, security and privacy concerns of performing an evaluation and management service by telephone and the availability of in person appointments. I also discussed with the patient that there may be a patient responsible charge related to this service. The patient expressed understanding and agreed to proceed.  PATIENT visit by telephone virtually in the context of Covid-19 pandemic. Patient location:  home My Location:  [facility] office Persons on the call:  Me and the patient   History of Present Illness: After being seen in the ED 08/27/2019 for htn and wrist splint "knot on wrist."  Moved here from [facility] recently and needs new PCP.  Restarted on Losartan/HCT 50/12.5.  Labs in January unremarkable but no BMP on file.  No CP/dizziness.  He has occasional HA in the morning.      Observations/Objective: A&Ox3  Assessment and Plan: 1. Hypertension, unspecified type Check BP OOO 3-5 times weekly and record - losartan-hydrochlorothiazide (HYZAAR) 50-12.5 MG tablet; Take 1 tablet by mouth daily.  Dispense: 30 tablet; Refill: 1  2. Right wrist pain Use splint as needed - Ambulatory referral to Hand Surgery  3. Encounter for examination following treatment at hospital Doing well    Follow Up Instructions: Assign PCP 1 moth and BP check   I discussed the assessment and treatment plan with the patient. The patient was provided an opportunity to ask questions and all were answered. The patient agreed with the plan and demonstrated an understanding of the instructions.   The patient was advised to call back or seek an in-person evaluation if the symptoms worsen or if the condition fails to improve  as anticipated.  I provided 9 minutes of non-face-to-face time during this encounter.   Freeman Caldron, PA-C

## 2019-09-08 ENCOUNTER — Other Ambulatory Visit: Payer: Self-pay

## 2019-09-08 ENCOUNTER — Ambulatory Visit: Payer: Self-pay | Attending: Family Medicine | Admitting: Physician Assistant

## 2019-09-08 DIAGNOSIS — Z09 Encounter for follow-up examination after completed treatment for conditions other than malignant neoplasm: Secondary | ICD-10-CM

## 2019-09-08 DIAGNOSIS — I10 Essential (primary) hypertension: Secondary | ICD-10-CM

## 2019-09-08 DIAGNOSIS — M25531 Pain in right wrist: Secondary | ICD-10-CM

## 2019-09-08 MED ORDER — CELECOXIB 200 MG PO CAPS: 200.0000 mg | ORAL_CAPSULE | Freq: Every day | ORAL | 1 refills | Status: DC

## 2019-09-08 MED ORDER — LOSARTAN POTASSIUM-HCTZ 50-12.5 MG PO TABS: 1.0000 | ORAL_TABLET | Freq: Every day | ORAL | 1 refills | Status: DC

## 2019-09-08 MED FILL — CELECOXIB 200 MG CAP: 200 | 30 days supply | Qty: 30 | Fill #0

## 2019-09-15 ENCOUNTER — Encounter: Payer: Self-pay | Admitting: Gastroenterology

## 2019-09-26 ENCOUNTER — Ambulatory Visit (HOSPITAL_COMMUNITY)
Admission: RE | Admit: 2019-09-26 | Discharge: 2019-09-26 | Disposition: A | Discharge status: Home / Self Care | Payer: Self-pay | Source: Ambulatory Visit | Attending: Family Medicine | Admitting: Family Medicine

## 2019-09-26 ENCOUNTER — Other Ambulatory Visit: Payer: Self-pay

## 2019-09-26 ENCOUNTER — Encounter: Payer: Self-pay | Admitting: Family Medicine

## 2019-09-26 ENCOUNTER — Ambulatory Visit: Payer: Self-pay | Attending: Family Medicine | Admitting: Family Medicine

## 2019-09-26 VITALS — BP 130/75 | HR 64 | Ht 67.0 in | Wt 243.0 lb

## 2019-09-26 DIAGNOSIS — I10 Essential (primary) hypertension: Secondary | ICD-10-CM

## 2019-09-26 DIAGNOSIS — M238X1 Other internal derangements of right knee: Secondary | ICD-10-CM

## 2019-09-26 DIAGNOSIS — M25531 Pain in right wrist: Secondary | ICD-10-CM | POA: Insufficient documentation

## 2019-09-26 DIAGNOSIS — R519 Headache, unspecified: Secondary | ICD-10-CM

## 2019-09-26 MED ORDER — LOSARTAN POTASSIUM-HCTZ 50-12.5 MG PO TABS: 1.0000 | ORAL_TABLET | Freq: Every day | ORAL | 6 refills | Status: DC

## 2019-09-26 MED ORDER — PREDNISONE 20 MG PO TABS: 20.0000 mg | ORAL_TABLET | Freq: Every day | ORAL | 0 refills | Status: DC

## 2019-09-26 MED ORDER — CETIRIZINE HCL 10 MG PO TABS: 10.0000 mg | ORAL_TABLET | Freq: Every day | ORAL | 1 refills | Status: DC

## 2019-09-26 MED FILL — predniSONE 20 MG TABS: 20 | 5 days supply | Qty: 5 | Fill #0

## 2019-09-26 MED FILL — LOSARTAN-HCTZ 50-12.5 MG TA: 50-12.5 | 30 days supply | Qty: 30 | Fill #0

## 2019-09-26 NOTE — Progress Notes (Signed)
Established Patient Office Visit  Subjective:  Patient ID: Fernando Cook, male    DOB: [birth year]  Age: 54 y.o. MRN: [id number]  CC:  Chief Complaint  Patient presents with  . Hypertension    HPI Olivia Bagot is a 54 year old male with a history of hypertension who presents today for a follow up visit.  His blood pressure is controlled today and he endorses compliance with antihypertensive.  He is unable to check it at home.  He is having frequent headaches that occur nearly every morning when he wakes up but spontaneously resolve after about half an hour without treatment.  These symptoms began when he moved to [facility] in 05/2019.  Denies rhinorrhea, sinus pain.  Denies nausea and vomiting.  Today the patient complains of right hand pain that has been present for months and the patient rates 7/10 and sharp.  No trauma reported.  He has been wearing a wrist which helps but the Celebrex he takes for his knee pain really has not helped the pain.  His right knee pain began in June of 2020 after a fall and he rates the pain as sharp and 10/10.  He saw an orthopedic specialist who told him that he had a torn ligament but due to not having insurance, the patient was unable to have follow up treatment.  He takes Celebrex with some improvement of symptoms.  No aggravating or relieving factors.  Past Medical History:  Diagnosis Date  . Hiatal hernia   . Hypertension   . Substance abuse ([place])    ETOH/ stopped drinking nov. 2020    Past Surgical History:  Procedure Laterality Date  . NO PAST SURGERIES      Family History  Problem Relation Age of Onset  . Hypertension Mother   . Hyperlipidemia Mother   . Prostate cancer Father   . Esophageal cancer Neg Hx   . Colon cancer Neg Hx   . Stomach cancer Neg Hx   . Pancreatic cancer Neg Hx     Social History   Socioeconomic History  . Marital status: Single    Spouse name: Not on file  . Number of children: Not on file  .  Years of education: Not on file  . Highest education level: Not on file  Occupational History  . Not on file  Tobacco Use  . Smoking status: Never Smoker  . Smokeless tobacco: Never Used  Substance and Sexual Activity  . Alcohol use: Not Currently  . Drug use: Not Currently  . Sexual activity: Not on file  Other Topics Concern  . Not on file  Social History Narrative  . Not on file   Social Determinants of Health   Financial Resource Strain:   . Difficulty of Paying Living Expenses: Not on file  Food Insecurity:   . Worried About Charity fundraiser in the Last Year: Not on file  . Ran Out of Food in the Last Year: Not on file  Transportation Needs:   . Lack of Transportation (Medical): Not on file  . Lack of Transportation (Non-Medical): Not on file  Physical Activity:   . Days of Exercise per Week: Not on file  . Minutes of Exercise per Session: Not on file  Stress:   . Feeling of Stress : Not on file  Social Connections:   . Frequency of Communication with Friends and Family: Not on file  . Frequency of Social Gatherings with Friends and Family: Not on file  .  Attends Religious Services: Not on file  . Active Member of Clubs or Organizations: Not on file  . Attends Archivist Meetings: Not on file  . Marital Status: Not on file  Intimate Partner Violence:   . Fear of Current or Ex-Partner: Not on file  . Emotionally Abused: Not on file  . Physically Abused: Not on file  . Sexually Abused: Not on file    Outpatient Medications Prior to Visit  Medication Sig Dispense Refill  . celecoxib (CELEBREX) 200 MG capsule Take 1 capsule (200 mg total) by mouth daily. Prn pain 30 capsule 1  . pantoprazole (PROTONIX) 40 MG tablet Take 1 tablet (40 mg total) by mouth 2 (two) times daily before a meal. 90 tablet 3  . losartan-hydrochlorothiazide (HYZAAR) 50-12.5 MG tablet Take 1 tablet by mouth daily. 30 tablet 1  . omeprazole (PRILOSEC) 20 MG capsule Take 1 capsule (20  mg total) by mouth daily. (Patient not taking: Reported on 09/26/2019) 60 capsule 1  . sucralfate (CARAFATE) 1 g tablet Take 1 tablet (1 g total) by mouth 4 (four) times daily -  with meals and at bedtime. (Patient not taking: Reported on 09/26/2019) 120 tablet 1   No facility-administered medications prior to visit.    No Known Allergies  ROS Review of Systems  Constitutional: Negative for fatigue, fever and unexpected weight change.  HENT: Negative for congestion, rhinorrhea, sinus pressure and sinus pain.   Eyes: Negative for visual disturbance.  Respiratory: Negative for cough, chest tightness and shortness of breath.   Cardiovascular: Negative for chest pain, palpitations and leg swelling.  Gastrointestinal: Negative for abdominal distention, abdominal pain, constipation, diarrhea, nausea and vomiting.  Endocrine: Negative for polydipsia and polyuria.  Genitourinary: Negative for decreased urine volume, difficulty urinating and dysuria.  Musculoskeletal: Positive for arthralgias and joint swelling. Negative for myalgias.       Right knee Right wrist  Skin: Negative for color change and rash.  Neurological: Negative for dizziness, tremors, weakness and numbness.  Hematological: Does not bruise/bleed easily.  Psychiatric/Behavioral: Negative for agitation and behavioral problems.      Objective:    Physical Exam  Constitutional: He is oriented to person, place, and time. He appears well-developed and well-nourished.  HENT:  Head: Normocephalic and atraumatic.  Eyes: Pupils are equal, round, and reactive to light. Conjunctivae and EOM are normal.  Cardiovascular: Normal rate, regular rhythm, normal heart sounds and intact distal pulses.  No murmur heard. Pulmonary/Chest: Effort normal and breath sounds normal.  Abdominal: Soft. Bowel sounds are normal. He exhibits no distension. There is no abdominal tenderness.  Musculoskeletal:        General: Normal range of motion.      Right wrist: Swelling and bony tenderness present.     Right knee: Bony tenderness present. No swelling or erythema. Normal range of motion.  Neurological: He is alert and oriented to person, place, and time.  Skin: Skin is warm and dry. No rash noted. No erythema.  Psychiatric: He has a normal mood and affect. His behavior is normal.  Nursing note and vitals reviewed.   BP 130/75   Pulse 64   Ht 5\' 7"  (1.702 m)   Wt 243 lb (110.2 kg)   SpO2 97%   BMI 38.06 kg/m  Wt Readings from Last 3 Encounters:  09/26/19 243 lb (110.2 kg)  08/27/19 239 lb (108.4 kg)  08/15/19 239 lb (108.4 kg)     Health Maintenance Due  Topic Date Due  .  Hepatitis C Screening  1965-08-29  . HIV Screening  11/12/1978  . TETANUS/TDAP  11/12/1982    There are no preventive care reminders to display for this patient.  No results found for: TSH Lab Results  Component Value Date   WBC 5.9 08/05/2019   HGB 12.7 (L) 08/05/2019   HCT 39.0 08/05/2019   MCV 81.6 08/05/2019   PLT 287.0 08/05/2019   Lab Results  Component Value Date   BILITOT 0.6 08/05/2019   ALKPHOS 72 08/05/2019   AST 17 08/05/2019   ALT 13 08/05/2019   PROT 7.4 08/05/2019   ALBUMIN 4.1 08/05/2019   No results found for: CHOL No results found for: HDL No results found for: LDLCALC No results found for: TRIG No results found for: CHOLHDL No results found for: HGBA1C    Assessment & Plan:   1. Right wrist pain Chronic Ordered radiographic images of wrist Begin prednisone Wear wrist brace for symptom releif Referral to orthopedics - predniSONE (DELTASONE) 20 MG tablet; Take 1 tablet (20 mg total) by mouth daily with breakfast.  Dispense: 5 tablet; Refill: 0 - DG Wrist Complete Right; Future  2. 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 Sinus headache Suspect sinus origin Begin cetirizine - cetirizine (ZYRTEC) 10 MG tablet; Take 1  tablet (10 mg total) by mouth daily.  Dispense: 30 tablet; Refill: 1  4. Hypertension, unspecified type Controlled Continue regimen Counseled on blood pressure goal of less than 130/80, low-sodium, DASH diet, medication compliance, 150 minutes of moderate intensity exercise per week. Discussed medication compliance, adverse effects. - losartan-hydrochlorothiazide (HYZAAR) 50-12.5 MG tablet; Take 1 tablet by mouth daily.  Dispense: 30 tablet; Refill: 6   Meds ordered this encounter  Medications  . predniSONE (DELTASONE) 20 MG tablet    Sig: Take 1 tablet (20 mg total) by mouth daily with breakfast.    Dispense:  5 tablet    Refill:  0  . losartan-hydrochlorothiazide (HYZAAR) 50-12.5 MG tablet    Sig: Take 1 tablet by mouth daily.    Dispense:  30 tablet    Refill:  6  . cetirizine (ZYRTEC) 10 MG tablet    Sig: Take 1 tablet (10 mg total) by mouth daily.    Dispense:  30 tablet    Refill:  1    Follow-up: Return in about 3 months (around 12/27/2019) for Chronic medical conditions.    Tomasita Morrow, RN   Evaluation and management procedures were performed by me with DNP Student in attendance, note written by DNP student under my supervision and collaboration. I have reviewed the note and I agree with the management and plan.  Mr. Hecht suffers from daily headaches with no pointers towards intracranial origin and symptoms date back to prior to initiation of antihypertensive.  We will treat as sinus headache and evaluate for improvement.  He also has an old ligamentous injury in his right knee from previous trauma and will benefit from seeing orthopedics.  His wrist symptom is suspicious for underlying osteoarthritis but I will get imaging given severity of symptoms and poor control on NSAID.  Hopefully short course of prednisone should help with this. He has been advised to apply for the [place] financial discount to facilitate his referral.  Charlott Rakes, MD, FAAFP. [facility] and [place], [place]   09/26/2019, 1:12 PM

## 2019-09-26 NOTE — Progress Notes (Signed)
Patient is having pain in right wrist and pain in right  knee. Patient states that he is sill having headaches. He takes his BP medication and pain goes away.

## 2019-09-30 ENCOUNTER — Telehealth: Payer: Self-pay

## 2019-09-30 NOTE — Telephone Encounter (Signed)
-----   Message from Charlott Rakes, MD sent at 09/27/2019  2:11 PM EST ----- Wrist x-ray reveals presence of arthritis.  It also reveals findings consistent with previous injury to a ligament.  No fracture is observed and I will recommend continuation of his current medication regimen.

## 2019-09-30 NOTE — Telephone Encounter (Signed)
Patient name and DOB has been verified Patient was informed of lab results. Patient had no questions.  

## 2019-10-12 MED FILL — CELECOXIB 200 MG CAP: 200 | 30 days supply | Qty: 30 | Fill #1

## 2019-10-13 ENCOUNTER — Ambulatory Visit: Payer: Self-pay | Admitting: Gastroenterology

## 2019-10-19 ENCOUNTER — Ambulatory Visit: Payer: Self-pay | Attending: Family Medicine

## 2019-10-19 ENCOUNTER — Other Ambulatory Visit: Payer: Self-pay

## 2019-10-27 ENCOUNTER — Ambulatory Visit: Payer: Self-pay | Attending: Family Medicine | Admitting: Family Medicine

## 2019-10-27 ENCOUNTER — Other Ambulatory Visit: Payer: Self-pay

## 2019-10-27 DIAGNOSIS — M238X1 Other internal derangements of right knee: Secondary | ICD-10-CM

## 2019-10-27 DIAGNOSIS — M25531 Pain in right wrist: Secondary | ICD-10-CM

## 2019-10-27 NOTE — Progress Notes (Signed)
Virtual Visit via Telephone Note  I connected with Desiree Hane, on 10/27/2019 at 10:26 AM by telephone due to the COVID-19 pandemic and verified that I am speaking with the correct person using two identifiers.   Consent: I discussed the limitations, risks, security and privacy concerns of performing an evaluation and management service by telephone and the availability of in person appointments. I also discussed with the patient that there may be a patient responsible charge related to this service. The patient expressed understanding and agreed to proceed.   Location of Patient: Home  Location of Provider: Clinic   Persons participating in Telemedicine visit: Irbin Kurman Farrington-CMA Dr. Margarita Rana     History of Present Illness: Jasn Venecia is a 54 year old male with a history of hypertension who presents today for a follow up of his right wrist pain and bilateral knee pains. At his last office visit 1 month ago I had ordered a right wrist x-ray.  He had also informed me at the time a previous orthopedic had informed him he would need surgery on his knees due to a torn ligament however lack of insurance precluded this. His right wrist x-ray revealed chronic degenerative changes.  He informs me he was unaware of his x-ray report however review of his chart indicates the CMA had informed him of his results on 09/30/2019.   I referred him to orthopedics however referral was closed. He informs me he never applied for the [facility] discount.  Past Medical History:  Diagnosis Date  . Hiatal hernia   . Hypertension   . Substance abuse ([place])    ETOH/ stopped drinking nov. 2020   No Known Allergies  Current Outpatient Medications on File Prior to Visit  Medication Sig Dispense Refill  . celecoxib (CELEBREX) 200 MG capsule Take 1 capsule (200 mg total) by mouth daily. Prn pain 30 capsule 1  . cetirizine (ZYRTEC) 10 MG tablet Take 1 tablet (10 mg total) by  mouth daily. 30 tablet 1  . losartan-hydrochlorothiazide (HYZAAR) 50-12.5 MG tablet Take 1 tablet by mouth daily. 30 tablet 6  . pantoprazole (PROTONIX) 40 MG tablet Take 1 tablet (40 mg total) by mouth 2 (two) times daily before a meal. 90 tablet 3  . omeprazole (PRILOSEC) 20 MG capsule Take 1 capsule (20 mg total) by mouth daily. (Patient not taking: Reported on 09/26/2019) 60 capsule 1  . predniSONE (DELTASONE) 20 MG tablet Take 1 tablet (20 mg total) by mouth daily with breakfast. (Patient not taking: Reported on 10/27/2019) 5 tablet 0  . sucralfate (CARAFATE) 1 g tablet Take 1 tablet (1 g total) by mouth 4 (four) times daily -  with meals and at bedtime. (Patient not taking: Reported on 09/26/2019) 120 tablet 1   No current facility-administered medications on file prior to visit.    Observations/Objective: Awake, alert, oriented x3 Not in acute distress  Assessment and Plan: 1. Right wrist pain Osteoarthritis He is upset and begins to yell on the phone that he thinks he has more than osteoarthritis going on in his right wrist Advised that he would need to complete his financial application process so his orthopedic referral could go through. Continue NSAIDs in the meantime.  2. Derangement of right knee ligament Uncontrolled We will benefit from orthopedic evaluation See #1 above   Follow Up Instructions: Keep previously scheduled appointment   I discussed the assessment and treatment plan with the patient. The patient was provided an opportunity to ask questions and all were  answered. The patient agreed with the plan and demonstrated an understanding of the instructions.   The patient was advised to call back or seek an in-person evaluation if the symptoms worsen or if the condition fails to improve as anticipated.     I provided 13 minutes total of non-face-to-face time during this encounter including median intraservice time, reviewing previous notes, investigations, ordering  medications, medical decision making, coordinating care and patient verbalized understanding at the end of the visit.     Charlott Rakes, MD, FAAFP. [facility] and [place], [place]   10/27/2019, 10:26 AM

## 2019-10-27 NOTE — Progress Notes (Signed)
Right knee pain. Right wrist pain. Needs refills on medication

## 2019-10-31 ENCOUNTER — Other Ambulatory Visit: Payer: Self-pay | Admitting: Physician Assistant

## 2019-10-31 ENCOUNTER — Telehealth: Payer: Self-pay

## 2019-10-31 NOTE — Telephone Encounter (Signed)
Patient was prescribed Celebrex by Levada Dy on 09/08/19 as prn, patient states that he has been taking more pills than needed and was denied refill due to being too early.  Patient and pharmacy is requesting a new script for a hight dosage.

## 2019-10-31 NOTE — Telephone Encounter (Signed)
Celebrex should be administered 1 tablet daily.  Refill has been sent to his pharmacy.

## 2019-11-02 NOTE — Telephone Encounter (Signed)
Patient was called and informed of medications being sent to pharmacy.

## 2019-11-10 MED FILL — CELECOXIB 200 MG CAPSULE: 200 | 30 days supply | Qty: 30 | Fill #0 | Status: TO

## 2019-11-27 ENCOUNTER — Emergency Department (HOSPITAL_COMMUNITY): Payer: Self-pay

## 2019-11-27 ENCOUNTER — Encounter (HOSPITAL_COMMUNITY): Payer: Self-pay

## 2019-11-27 ENCOUNTER — Emergency Department (HOSPITAL_COMMUNITY)
Admission: EM | Admit: 2019-11-27 | Discharge: 2019-11-27 | Disposition: A | Discharge status: Home / Self Care | Payer: Self-pay | Attending: Emergency Medicine | Admitting: Emergency Medicine

## 2019-11-27 DIAGNOSIS — I10 Essential (primary) hypertension: Secondary | ICD-10-CM | POA: Insufficient documentation

## 2019-11-27 DIAGNOSIS — R067 Sneezing: Secondary | ICD-10-CM | POA: Insufficient documentation

## 2019-11-27 DIAGNOSIS — Z79899 Other long term (current) drug therapy: Secondary | ICD-10-CM | POA: Insufficient documentation

## 2019-11-27 DIAGNOSIS — R071 Chest pain on breathing: Secondary | ICD-10-CM | POA: Insufficient documentation

## 2019-11-27 DIAGNOSIS — R0789 Other chest pain: Secondary | ICD-10-CM | POA: Insufficient documentation

## 2019-11-27 MED ORDER — METHOCARBAMOL 500 MG PO TABS: 500.0000 mg | ORAL_TABLET | Freq: Every evening | ORAL | 0 refills | Status: DC | PRN

## 2019-11-27 MED ORDER — LIDOCAINE 5 % EX PTCH
1.0000 | MEDICATED_PATCH | CUTANEOUS | Status: DC
  Administered 2019-11-27: 1 via TRANSDERMAL
  Filled 2019-11-27: qty 1

## 2019-11-27 NOTE — ED Provider Notes (Signed)
[facility] DEPT Provider Note   CSN: [id number] Arrival date & time: 11/27/19  1040     History Chief Complaint  Patient presents with  . Rib Injury    Fernando Cook is a 54 y.o. male presenting for evaluation of right rib pain.  Patient states around 10:00 this morning he sneezed and he had acute onset right-sided rib/thorax pain.  Pain is been constant since.  Nothing makes it better.  Pain is worse when he takes a deep breath in.  He states he took a pain pill, but does not know the name.  He states it was an over-the-counter medicine, not a prescription.  This did not help with symptoms.  He denies symptoms on the left side.  Pain is not moving or radiating.  He denies recent fevers, chills, cough.  He denies nausea, vomiting, abdominal pain, or urinary symptoms.  HPI     Past Medical History:  Diagnosis Date  . Hiatal hernia   . Hypertension   . Substance abuse ([place])    ETOH/ stopped drinking nov. 2020    Patient Active Problem List   Diagnosis Date Noted  . Hypertension 09/08/2019    Past Surgical History:  Procedure Laterality Date  . NO PAST SURGERIES         Family History  Problem Relation Age of Onset  . Hypertension Mother   . Hyperlipidemia Mother   . Prostate cancer Father   . Esophageal cancer Neg Hx   . Colon cancer Neg Hx   . Stomach cancer Neg Hx   . Pancreatic cancer Neg Hx     Social History   Tobacco Use  . Smoking status: Never Smoker  . Smokeless tobacco: Never Used  Substance Use Topics  . Alcohol use: Not Currently  . Drug use: Not Currently    Home Medications Prior to Admission medications   Medication Sig Start Date End Date Taking? Authorizing Provider  celecoxib (CELEBREX) 200 MG capsule TAKE 1 CAPSULE (200 MG TOTAL) BY MOUTH DAILY AS NEEDED FOR PAIN 10/31/19   Charlott Rakes, MD  cetirizine (ZYRTEC) 10 MG tablet Take 1 tablet (10 mg total) by mouth daily. 09/26/19   Charlott Rakes, MD    losartan-hydrochlorothiazide (HYZAAR) 50-12.5 MG tablet Take 1 tablet by mouth daily. 09/26/19   Charlott Rakes, MD  methocarbamol (ROBAXIN) 500 MG tablet Take 1 tablet (500 mg total) by mouth at bedtime as needed for muscle spasms. 11/27/19   Damira Kem, PA-C  omeprazole (PRILOSEC) 20 MG capsule Take 1 capsule (20 mg total) by mouth daily. Patient not taking: Reported on 09/26/2019 07/15/19 11/12/19  Wyvonnia Dusky, MD  pantoprazole (PROTONIX) 40 MG tablet Take 1 tablet (40 mg total) by mouth 2 (two) times daily before a meal. 08/15/19   Nandigam, Venia Minks, MD  predniSONE (DELTASONE) 20 MG tablet Take 1 tablet (20 mg total) by mouth daily with breakfast. Patient not taking: Reported on 10/27/2019 09/26/19   Charlott Rakes, MD  sucralfate (CARAFATE) 1 g tablet Take 1 tablet (1 g total) by mouth 4 (four) times daily -  with meals and at bedtime. Patient not taking: Reported on 09/26/2019 08/15/19   Mauri Pole, MD    Allergies    Patient has no known allergies.  Review of Systems   Review of Systems  Cardiovascular: Chest pain: R sided rib pain.    Physical Exam Updated Vital Signs BP 122/78 (BP Location: Left Arm)   Pulse 75  Temp 97.7 F (36.5 C) (Oral)   Resp 18   SpO2 97%   Physical Exam Vitals and nursing note reviewed.  Constitutional:      General: He is not in acute distress.    Appearance: He is well-developed.     Comments: Nontoxic  HENT:     Head: Normocephalic and atraumatic.  Cardiovascular:     Rate and Rhythm: Normal rate and regular rhythm.     Pulses: Normal pulses.  Pulmonary:     Effort: Pulmonary effort is normal. No respiratory distress.     Breath sounds: Normal breath sounds. No wheezing.     Comments: Speaking in full sentences.  Clear lung sounds in all fields.  No rash or erythema or deformity noted of the right sided chest wall. Tenderness palpation of the lateral lower ribs/chest wall on the right side. Chest:     Chest wall:  Tenderness present.  Abdominal:     General: There is no distension.     Palpations: There is no mass.     Tenderness: There is no abdominal tenderness. There is no right CVA tenderness, guarding or rebound.     Comments: No ttp of the abd and no CVA tenderness   Musculoskeletal:        General: Normal range of motion.     Cervical back: Normal range of motion.  Skin:    General: Skin is warm.     Capillary Refill: Capillary refill takes less than 2 seconds.     Findings: No rash.  Neurological:     Mental Status: He is alert and oriented to person, place, and time.     ED Results / Procedures / Treatments   Labs (all labs ordered are listed, but only abnormal results are displayed) Labs Reviewed - No data to display  EKG None  Radiology DG Ribs Unilateral W/Chest Right  Result Date: 11/27/2019 CLINICAL DATA:  Pain after sneezing EXAM: RIGHT RIBS AND CHEST - 3+ VIEW COMPARISON:  None. FINDINGS: Frontal chest as well as oblique and cone-down rib images were obtained. Lungs are clear. Heart is upper normal in size with pulmonary vascularity normal. There is evidence of prior fractures with healing involving the anterior right eighth and tenth ribs no acute fracture evident. No pneumothorax or pleural effusion. IMPRESSION: Healed fractures anterior right eighth and tenth ribs. No acute fracture evident. Lungs clear. Heart upper normal in size. Electronically Signed   By: Lowella Grip III M.D.   On: 11/27/2019 12:07    Procedures Procedures (including critical care time)  Medications Ordered in ED Medications  lidocaine (LIDODERM) 5 % 1 patch (1 patch Transdermal Patch Applied 11/27/19 1157)    ED Course  I have reviewed the triage vital signs and the nursing notes.  Pertinent labs & imaging results that were available during my care of the patient were reviewed by me and considered in my medical decision making (see chart for details).    MDM Rules/Calculators/A&P                       Pt presenting for evaluation of R sided rib/thorax pain after sneezing. Likely MSK strain. Less likey fx or pnx, however as pt reports severe pain, will obtain xray to r/u concerning causing for pain. lidoderm patch given while xrays pending.   Xray viewed and interpreted by me, no fx, dislocation, or pnx. As such, likely msk strain. Will have pt tx symptomatically with nsaids, tylenol,  and muscle relaxer. Discussed importance of taking deep breaths to prevent infection. At this time, pt appears safe for d/c. Return precautions given. Pt states he understands and agrees to plan.   Final Clinical Impression(s) / ED Diagnoses Final diagnoses:  Right-sided chest wall pain    Rx / DC Orders ED Discharge Orders         Ordered    methocarbamol (ROBAXIN) 500 MG tablet  At bedtime PRN     11/27/19 1214           Shellia Hartl, PA-C 11/27/19 1221    Dorie Rank, MD 11/27/19 1523

## 2019-11-27 NOTE — ED Triage Notes (Addendum)
Pt arrived via [facility] from home.   Reports making breakfast this morning and sneezed. Right after sneezing c/o right flank sharp pain. 11/10 pain per patient   Hx. 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

## 2019-11-27 NOTE — Discharge Instructions (Signed)
Take ibuprofen 3 times a day with meals.  Do not take other anti-inflammatories at the same time (Advil, Motrin, naproxen, Aleve). You may supplement with Tylenol if you need further pain control. Use robaxin as needed for muscle stiffness or soreness.  Have caution, this may make you tired or groggy.  Do not drive or operate heavy machinery while taking this medicine. Use ice packs or heating pads if this helps control your pain. Use muscle creams (salonpas, icyhot, bengay, biofreeze) for pain control.  You will likely have continued muscle stiffness and soreness over the next couple days.  Follow-up with primary care in 1 week if your symptoms are not improving. Make sure you are taking deep breaths in to prevent developing a pneumonia. Return to the emergency room if you develop increased difficulty breathing, fevers, cough, or any new, worsening, or concerning symptoms.

## 2019-11-27 NOTE — ED Notes (Signed)
Pt transported to Xray. 

## 2019-12-12 ENCOUNTER — Emergency Department (HOSPITAL_COMMUNITY)
Admission: EM | Admit: 2019-12-12 | Discharge: 2019-12-12 | Disposition: A | Discharge status: Home / Self Care | Payer: No Typology Code available for payment source | Attending: Emergency Medicine | Admitting: Emergency Medicine

## 2019-12-12 ENCOUNTER — Encounter (HOSPITAL_COMMUNITY): Payer: Self-pay | Admitting: *Deleted

## 2019-12-12 ENCOUNTER — Other Ambulatory Visit: Payer: Self-pay

## 2019-12-12 DIAGNOSIS — S6991XA Unspecified injury of right wrist, hand and finger(s), initial encounter: Secondary | ICD-10-CM | POA: Diagnosis present

## 2019-12-12 DIAGNOSIS — I10 Essential (primary) hypertension: Secondary | ICD-10-CM | POA: Diagnosis not present

## 2019-12-12 DIAGNOSIS — S61212A Laceration without foreign body of right middle finger without damage to nail, initial encounter: Secondary | ICD-10-CM | POA: Diagnosis not present

## 2019-12-12 DIAGNOSIS — Y92010 Kitchen of single-family (private) house as the place of occurrence of the external cause: Secondary | ICD-10-CM | POA: Diagnosis not present

## 2019-12-12 DIAGNOSIS — Y998 Other external cause status: Secondary | ICD-10-CM | POA: Insufficient documentation

## 2019-12-12 DIAGNOSIS — Z79899 Other long term (current) drug therapy: Secondary | ICD-10-CM | POA: Diagnosis not present

## 2019-12-12 DIAGNOSIS — Y93G1 Activity, food preparation and clean up: Secondary | ICD-10-CM | POA: Insufficient documentation

## 2019-12-12 DIAGNOSIS — W260XXA Contact with knife, initial encounter: Secondary | ICD-10-CM | POA: Insufficient documentation

## 2019-12-12 NOTE — ED Provider Notes (Addendum)
[facility] EMERGENCY DEPARTMENT Provider Note   CSN: [id number] Arrival date & time: 12/12/19  1850     History Chief Complaint  Patient presents with  . Extremity Laceration    Fernando Cook is a 54 y.o. male with PMHx substance abuse and HTN who presents to the ED today with laceration to his R middle finger that he sustained at work. Pt reports he was wearing 3 pairs of gloves and cutting lettuce on a machine when he accidentally sliced his finger. Bleeding controlled at this time. Tetanus is up to date. Pt complains of mild pain to the finger but has no other complaints at this time.   The history is provided by the patient and medical records.       Past Medical History:  Diagnosis Date  . Hiatal hernia   . Hypertension   . Substance abuse ([place])    ETOH/ stopped drinking nov. 2020    Patient Active Problem List   Diagnosis Date Noted  . Hypertension 09/08/2019    Past Surgical History:  Procedure Laterality Date  . NO PAST SURGERIES         Family History  Problem Relation Age of Onset  . Hypertension Mother   . Hyperlipidemia Mother   . Prostate cancer Father   . Esophageal cancer Neg Hx   . Colon cancer Neg Hx   . Stomach cancer Neg Hx   . Pancreatic cancer Neg Hx     Social History   Tobacco Use  . Smoking status: Never Smoker  . Smokeless tobacco: Never Used  Substance Use Topics  . Alcohol use: Not Currently  . Drug use: Not Currently    Home Medications Prior to Admission medications   Medication Sig Start Date End Date Taking? Authorizing Provider  celecoxib (CELEBREX) 200 MG capsule TAKE 1 CAPSULE (200 MG TOTAL) BY MOUTH DAILY AS NEEDED FOR PAIN 10/31/19   Charlott Rakes, MD  cetirizine (ZYRTEC) 10 MG tablet Take 1 tablet (10 mg total) by mouth daily. 09/26/19   Charlott Rakes, MD  losartan-hydrochlorothiazide (HYZAAR) 50-12.5 MG tablet Take 1 tablet by mouth daily. 09/26/19   Charlott Rakes, MD  methocarbamol  (ROBAXIN) 500 MG tablet Take 1 tablet (500 mg total) by mouth at bedtime as needed for muscle spasms. 11/27/19   Caccavale, Sophia, PA-C  omeprazole (PRILOSEC) 20 MG capsule Take 1 capsule (20 mg total) by mouth daily. Patient not taking: Reported on 09/26/2019 07/15/19 11/12/19  Wyvonnia Dusky, MD  pantoprazole (PROTONIX) 40 MG tablet Take 1 tablet (40 mg total) by mouth 2 (two) times daily before a meal. 08/15/19   Nandigam, Venia Minks, MD  predniSONE (DELTASONE) 20 MG tablet Take 1 tablet (20 mg total) by mouth daily with breakfast. Patient not taking: Reported on 10/27/2019 09/26/19   Charlott Rakes, MD  sucralfate (CARAFATE) 1 g tablet Take 1 tablet (1 g total) by mouth 4 (four) times daily -  with meals and at bedtime. Patient not taking: Reported on 09/26/2019 08/15/19   Mauri Pole, MD    Allergies    Patient has no known allergies.  Review of Systems   Review of Systems  Constitutional: Negative for chills and fever.  Musculoskeletal: Positive for arthralgias.  Skin: Positive for wound.    Physical Exam Updated Vital Signs BP 133/68   Pulse 78   Temp 98.6 F (37 C) (Oral)   Resp 18   Wt 110.2 kg   SpO2 96%   BMI  38.06 kg/m   Physical Exam Vitals and nursing note reviewed.  Constitutional:      Appearance: He is not ill-appearing.  HENT:     Head: Normocephalic and atraumatic.  Eyes:     Conjunctiva/sclera: Conjunctivae normal.  Cardiovascular:     Rate and Rhythm: Normal rate and regular rhythm.  Pulmonary:     Effort: Pulmonary effort is normal.     Breath sounds: Normal breath sounds.  Musculoskeletal:     Comments: 1 cm superficial laceration directly in the middle of the distal fat pad; bleeding controlled. No nail involvement. Cap refill < 2 seconds. ROM intact to MCP, PIP, and DIP joints. 2+ radial pulse.   Skin:    General: Skin is warm and dry.     Coloration: Skin is not jaundiced.  Neurological:     Mental Status: He is alert.     ED Results /  Procedures / Treatments   Labs (all labs ordered are listed, but only abnormal results are displayed) Labs Reviewed - No data to display  EKG None  Radiology No results found.  Procedures .Marland KitchenLaceration Repair  Date/Time: 12/12/2019 10:04 PM Performed by: Eustaquio Maize, PA-C Authorized by: Eustaquio Maize, PA-C   Consent:    Consent obtained:  Verbal   Consent given by:  Patient   Risks discussed:  Infection, pain and poor cosmetic result Repair type:    Repair type:  Simple Treatment:    Area cleansed with:  Betadine   Irrigation solution:  Sterile saline Skin repair:    Repair method:  Tissue adhesive Approximation:    Approximation:  Close Post-procedure details:    Dressing:  Non-adherent dressing   Patient tolerance of procedure:  Tolerated well, no immediate complications   (including critical care time)  Medications Ordered in ED Medications - No data to display  ED Course  I have reviewed the triage vital signs and the nursing notes.  Pertinent labs & imaging results that were available during my care of the patient were reviewed by me and considered in my medical decision making (see chart for details).    MDM Rules/Calculators/A&P                      54 year old male presenting to the ED today after sustaining a laceration to his R middle finger at work today while cutting lettuce. Bleeding controlled. Tetanus up to date. On arrival to the ED pt is febrile, nontachycardic, and nontachypneic. He is in NAD. Very superficial laceration to the distal pad of his finger, do not feel he needs sutures. Wound cleaned with betadine and sterile saline. Dermabond applied and pt discharged home with PCP follow up. Strict return precautions discussed. Pt is in agreement with plan and stable for discharge home.   This note was prepared using Dragon voice recognition software and may include unintentional dictation errors due to the inherent limitations of voice  recognition software.  Final Clinical Impression(s) / ED Diagnoses Final diagnoses:  Laceration of right middle finger without foreign body without damage to nail, initial encounter    Rx / DC Orders ED Discharge Orders    None       Discharge Instructions     Keep wound clean and dry. Keep it covered at work The skin glue will come off by itself in about 5-7 days Do not soak hands in water for an extended period of time as this will dissolve the glue faster Follow up  with your PCP regarding your ED visit today Return to the ED IMMEDIATELY for any signs of infection including redness/swelling around the wound, drainage of pus, fevers > 100.4, or chills.        Eustaquio Maize, PA-C 12/12/19 2203    Eustaquio Maize, PA-C 12/12/19 2204    Quintella Reichert, MD 12/13/19 [phone]

## 2019-12-12 NOTE — ED Notes (Signed)
Discharge instructions discussed with pt. Pt verbalized understanding. Pt stable and ambulatory. No signature pad available. 

## 2019-12-12 NOTE — Discharge Instructions (Addendum)
Keep wound clean and dry. Keep it covered at work The skin glue will come off by itself in about 5-7 days Do not soak hands in water for an extended period of time as this will dissolve the glue faster Follow up with your PCP regarding your ED visit today Return to the ED IMMEDIATELY for any signs of infection including redness/swelling around the wound, drainage of pus, fevers > 100.4, or chills.

## 2019-12-12 NOTE — ED Triage Notes (Signed)
Pt is here for laceration to the tip of his left middle finger.  Lac appears superficial.  Laceration occurred when when stuck his finger against a knife blade. 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 Last tetanus was 2 years ago.

## 2020-01-10 ENCOUNTER — Other Ambulatory Visit: Payer: Self-pay

## 2020-01-10 ENCOUNTER — Encounter (HOSPITAL_COMMUNITY): Payer: Self-pay

## 2020-01-10 ENCOUNTER — Ambulatory Visit (INDEPENDENT_AMBULATORY_CARE_PROVIDER_SITE_OTHER): Payer: Self-pay

## 2020-01-10 ENCOUNTER — Ambulatory Visit (HOSPITAL_COMMUNITY)
Admission: EM | Admit: 2020-01-10 | Discharge: 2020-01-10 | Disposition: A | Discharge status: Home / Self Care | Payer: Self-pay | Attending: Family Medicine | Admitting: Family Medicine

## 2020-01-10 DIAGNOSIS — S46911A Strain of unspecified muscle, fascia and tendon at shoulder and upper arm level, right arm, initial encounter: Secondary | ICD-10-CM

## 2020-01-10 DIAGNOSIS — M25511 Pain in right shoulder: Secondary | ICD-10-CM

## 2020-01-10 NOTE — Discharge Instructions (Signed)
Your x-ray did not show any broken bones.  This is most likely a strain of your shoulder and back from the fall. Rest, ice to the area.  You can do ibuprofen for pain as needed. Follow up as needed for continued or worsening symptoms

## 2020-01-10 NOTE — ED Provider Notes (Signed)
[facility]    CSN: [id number] Arrival date & time: 01/10/20  1144      History   Chief Complaint Chief Complaint  Patient presents with  . Fall    HPI Fernando Cook is a 54 y.o. male.   54 year old male that presents today with right shoulder pain and right upper back pain after slipping and falling today.  Reporting he was at work and slipped on some grease falling onto the right shoulder and upper back area.  Since he has had pain.  He has not take anything for the pain.  Mild tenderness with movement and limited range of motion.  No deformities.  No radiation of pain, numbness or tingling.  ROS per HPI      Past Medical History:  Diagnosis Date  . Hiatal hernia   . Hypertension   . Substance abuse ([place])    ETOH/ stopped drinking nov. 2020    Patient Active Problem List   Diagnosis Date Noted  . Hypertension 09/08/2019    Past Surgical History:  Procedure Laterality Date  . NO PAST SURGERIES         Home Medications    Prior to Admission medications   Medication Sig Start Date End Date Taking? Authorizing Provider  celecoxib (CELEBREX) 200 MG capsule TAKE 1 CAPSULE (200 MG TOTAL) BY MOUTH DAILY AS NEEDED FOR PAIN 10/31/19   Charlott Rakes, MD  cetirizine (ZYRTEC) 10 MG tablet Take 1 tablet (10 mg total) by mouth daily. 09/26/19   Charlott Rakes, MD  losartan-hydrochlorothiazide (HYZAAR) 50-12.5 MG tablet Take 1 tablet by mouth daily. 09/26/19   Charlott Rakes, MD  methocarbamol (ROBAXIN) 500 MG tablet Take 1 tablet (500 mg total) by mouth at bedtime as needed for muscle spasms. 11/27/19   Caccavale, Sophia, PA-C  pantoprazole (PROTONIX) 40 MG tablet Take 1 tablet (40 mg total) by mouth 2 (two) times daily before a meal. 08/15/19   Nandigam, Venia Minks, MD  omeprazole (PRILOSEC) 20 MG capsule Take 1 capsule (20 mg total) by mouth daily. Patient not taking: Reported on 09/26/2019 07/15/19 01/10/20  Wyvonnia Dusky, MD  sucralfate (CARAFATE) 1 g  tablet Take 1 tablet (1 g total) by mouth 4 (four) times daily -  with meals and at bedtime. Patient not taking: Reported on 09/26/2019 08/15/19 01/10/20  Mauri Pole, MD    Family History Family History  Problem Relation Age of Onset  . Hypertension Mother   . Hyperlipidemia Mother   . Prostate cancer Father   . Esophageal cancer Neg Hx   . Colon cancer Neg Hx   . Stomach cancer Neg Hx   . Pancreatic cancer Neg Hx     Social History Social History   Tobacco Use  . Smoking status: Never Smoker  . Smokeless tobacco: Never Used  Vaping Use  . Vaping Use: Never used  Substance Use Topics  . Alcohol use: Not Currently  . Drug use: Not Currently     Allergies   Patient has no known allergies.   Review of Systems Review of Systems   Physical Exam Triage Vital Signs ED Triage Vitals  Enc Vitals Group     BP 01/10/20 1242 (!) 123/105     Pulse Rate 01/10/20 1242 80     Resp 01/10/20 1242 16     Temp 01/10/20 1242 98 F (36.7 C)     Temp Source 01/10/20 1242 Oral     SpO2 01/10/20 1242 98 %  Weight --      Height --      Head Circumference --      Peak Flow --      Pain Score 01/10/20 1244 8     Pain Loc --      Pain Edu? --      Excl. in [place]? --    No data found.  Updated Vital Signs BP (!) 123/105 (BP Location: Left Arm)   Pulse 80   Temp 98 F (36.7 C) (Oral)   Resp 16   SpO2 98%   Visual Acuity Right Eye Distance:   Left Eye Distance:   Bilateral Distance:    Right Eye Near:   Left Eye Near:    Bilateral Near:     Physical Exam Vitals and nursing note reviewed.  Constitutional:      Appearance: Normal appearance.  HENT:     Head: Normocephalic and atraumatic.     Nose: Nose normal.  Eyes:     Conjunctiva/sclera: Conjunctivae normal.  Pulmonary:     Effort: Pulmonary effort is normal.  Musculoskeletal:        General: Normal range of motion.     Cervical back: Normal range of motion.     Comments: Mild generalized tenderness  to palpation of the right shoulder. No deformities, bruising.  Mild tenderness to trapezius muscle. Normal range of motion of right shoulder and right arm Strength normal 2+ radial pulse  Skin:    General: Skin is warm and dry.  Neurological:     Mental Status: He is alert.  Psychiatric:        Mood and Affect: Mood normal.      UC Treatments / Results  Labs (all labs ordered are listed, but only abnormal results are displayed) Labs Reviewed - No data to display  EKG   Radiology DG Shoulder Right  Result Date: 01/10/2020 CLINICAL DATA:  Right shoulder pain post fall EXAM: RIGHT SHOULDER - 2+ VIEW COMPARISON:  None FINDINGS: Osseous mineralization normal. AC joint alignment normal. No acute fracture, dislocation or bone destruction. Visualized ribs unremarkable. IMPRESSION: Normal exam. Electronically Signed   By: Lavonia Dana M.D.   On: 01/10/2020 13:46    Procedures Procedures (including critical care time)  Medications Ordered in UC Medications - No data to display  Initial Impression / Assessment and Plan / UC Course  I have reviewed the triage vital signs and the nursing notes.  Pertinent labs & imaging results that were available during my care of the patient were reviewed by me and considered in my medical decision making (see chart for details).     Shoulder pain X-ray without any abnormalities.  Most likely shoulder strain. Rest, ice, elevate.  Ibuprofen for pain as needed. Follow up as needed for continued or worsening symptoms  Final Clinical Impressions(s) / UC Diagnoses   Final diagnoses:  Shoulder strain, right, initial encounter     Discharge Instructions     Your x-ray did not show any broken bones.  This is most likely a strain of your shoulder and back from the fall. Rest, ice to the area.  You can do ibuprofen for pain as needed. Follow up as needed for continued or worsening symptoms     ED Prescriptions    None     PDMP not  reviewed this encounter.   Orvan July, NP 01/10/20 1453

## 2020-01-10 NOTE — ED Triage Notes (Signed)
Pt presents with right shoulder pain and back after a fall injury at work last night.

## 2020-01-11 ENCOUNTER — Encounter (HOSPITAL_COMMUNITY): Payer: Self-pay | Admitting: Emergency Medicine

## 2020-01-11 ENCOUNTER — Emergency Department (HOSPITAL_COMMUNITY)
Admission: EM | Admit: 2020-01-11 | Discharge: 2020-01-11 | Disposition: A | Discharge status: Home / Self Care | Payer: Self-pay | Attending: Emergency Medicine | Admitting: Emergency Medicine

## 2020-01-11 ENCOUNTER — Other Ambulatory Visit: Payer: Self-pay

## 2020-01-11 DIAGNOSIS — M545 Low back pain: Secondary | ICD-10-CM | POA: Insufficient documentation

## 2020-01-11 DIAGNOSIS — M25531 Pain in right wrist: Secondary | ICD-10-CM | POA: Insufficient documentation

## 2020-01-11 DIAGNOSIS — Z5321 Procedure and treatment not carried out due to patient leaving prior to being seen by health care provider: Secondary | ICD-10-CM | POA: Insufficient documentation

## 2020-01-11 DIAGNOSIS — Y990 Civilian activity done for income or pay: Secondary | ICD-10-CM | POA: Insufficient documentation

## 2020-01-11 DIAGNOSIS — Y9389 Activity, other specified: Secondary | ICD-10-CM | POA: Insufficient documentation

## 2020-01-11 DIAGNOSIS — Y9289 Other specified places as the place of occurrence of the external cause: Secondary | ICD-10-CM | POA: Insufficient documentation

## 2020-01-11 DIAGNOSIS — W010XXA Fall on same level from slipping, tripping and stumbling without subsequent striking against object, initial encounter: Secondary | ICD-10-CM | POA: Insufficient documentation

## 2020-01-11 DIAGNOSIS — M25511 Pain in right shoulder: Secondary | ICD-10-CM | POA: Insufficient documentation

## 2020-01-11 NOTE — ED Triage Notes (Signed)
Pt reports he slipped on some grease at work and fell a few days ago, c/o R shoulder, R wrist pain and lower back pain. Seen at [facility] yesterday and diagnosed with sprain. States he needs something for the pain so he can return to work.

## 2020-01-22 ENCOUNTER — Emergency Department (HOSPITAL_COMMUNITY)
Admission: EM | Admit: 2020-01-22 | Discharge: 2020-01-22 | Disposition: A | Discharge status: Home / Self Care | Payer: Self-pay | Attending: Emergency Medicine | Admitting: Emergency Medicine

## 2020-01-22 ENCOUNTER — Other Ambulatory Visit: Payer: Self-pay

## 2020-01-22 ENCOUNTER — Encounter (HOSPITAL_COMMUNITY): Payer: Self-pay | Admitting: Emergency Medicine

## 2020-01-22 ENCOUNTER — Emergency Department (HOSPITAL_COMMUNITY): Payer: Self-pay

## 2020-01-22 DIAGNOSIS — Z79899 Other long term (current) drug therapy: Secondary | ICD-10-CM | POA: Insufficient documentation

## 2020-01-22 DIAGNOSIS — D320 Benign neoplasm of cerebral meninges: Secondary | ICD-10-CM

## 2020-01-22 DIAGNOSIS — I10 Essential (primary) hypertension: Secondary | ICD-10-CM | POA: Insufficient documentation

## 2020-01-22 DIAGNOSIS — R519 Headache, unspecified: Secondary | ICD-10-CM

## 2020-01-22 LAB — BASIC METABOLIC PANEL
Anion gap: 8 (ref 5–15)
BUN: 12 mg/dL (ref 6–20)
CO2: 30 mmol/L (ref 22–32)
Calcium: 8.7 mg/dL — ABNORMAL LOW (ref 8.9–10.3)
Chloride: 100 mmol/L (ref 98–111)
Creatinine, Ser: 0.9 mg/dL (ref 0.61–1.24)
GFR calc Af Amer: >60 mL/min (ref >60)
GFR calc non Af Amer: >60 mL/min (ref >60)
Glucose, Bld: 101 mg/dL — ABNORMAL HIGH (ref 70–99)
Potassium: 3.7 mmol/L (ref 3.5–5.1)
Sodium: 138 mmol/L (ref 135–145)

## 2020-01-22 LAB — CBC
HCT: 41 % (ref 39.0–52.0)
Hemoglobin: 12.9 g/dL — ABNORMAL LOW (ref 13.0–17.0)
MCH: 27 pg (ref 26.0–34.0)
MCHC: 31.5 g/dL (ref 30.0–36.0)
MCV: 86 fL (ref 80.0–100.0)
Platelets: 235 10*3/uL (ref 150–400)
RBC: 4.77 MIL/uL (ref 4.22–5.81)
RDW: 13.6 % (ref 11.5–15.5)
WBC: 7.9 10*3/uL (ref 4.0–10.5)
nRBC: 0 % (ref 0.0–0.2)

## 2020-01-22 MED ORDER — SODIUM CHLORIDE 0.9 % IV BOLUS
500.0000 mL | Freq: Once | INTRAVENOUS | Status: AC
  Administered 2020-01-22: 500 mL via INTRAVENOUS

## 2020-01-22 MED ORDER — PROCHLORPERAZINE EDISYLATE 10 MG/2ML IJ SOLN
5.0000 mg | Freq: Once | INTRAMUSCULAR | Status: AC
  Administered 2020-01-22: 5 mg via INTRAVENOUS
  Filled 2020-01-22: qty 2

## 2020-01-22 MED ORDER — DIPHENHYDRAMINE HCL 50 MG/ML IJ SOLN
12.5000 mg | Freq: Once | INTRAMUSCULAR | Status: AC
  Administered 2020-01-22: 12.5 mg via INTRAVENOUS
  Filled 2020-01-22: qty 1

## 2020-01-22 MED ORDER — KETOROLAC TROMETHAMINE 30 MG/ML IJ SOLN
15.0000 mg | Freq: Once | INTRAMUSCULAR | Status: AC
  Administered 2020-01-22: 15 mg via INTRAVENOUS
  Filled 2020-01-22: qty 1

## 2020-01-22 NOTE — Discharge Instructions (Signed)
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 These are small benign (non-cancerous) tumors. It is possible, but unlikely, that these are causing your headache. These are not emergency findings, but you will need to follow up with the neurologist for further evaluation.  You are having a headache. No specific cause was found today for your headache. It may have been a migraine or other cause of headache. Stress, anxiety, fatigue, and depression are common triggers for headaches. Your headache today does not appear to be life-threatening or require hospitalization, but often the exact cause of headaches is not determined in the emergency department. Therefore, follow-up with your doctor is very important to find out what may have caused your headache, and whether or not you need any further diagnostic testing or treatment. Sometimes headaches can appear benign (not harmful), but then more serious symptoms can develop which should prompt an immediate re-evaluation by your doctor or the emergency department.  RETURN IMMEDIATELY IF you develop a sudden, severe headache or confusion, become poorly responsive or faint, develop a fever above 100.68F or problem breathing, have a change in speech, vision, swallowing, or understanding, or develop new weakness, numbness, tingling, incoordination, or have a seizure.

## 2020-01-22 NOTE — ED Provider Notes (Signed)
[place] DEPT Provider Note   CSN: [id number] Arrival date & time: 01/22/20  1907     History Chief Complaint  Patient presents with  . Headache    Fernando Cook is a 54 y.o. male.  The history is provided by medical records and the patient. No language interpreter was used.  Headache Pain location:  Frontal Quality:  Dull Radiates to:  Does not radiate Severity at highest:  10/10 Onset quality:  Gradual Duration:  3 days Timing:  Constant Progression:  Unchanged Chronicity:  New Similar to prior headaches: no   Context: not activity, not exposure to bright light, not caffeine, not coughing, not defecating, not eating, not stress, not exposure to cold air, not intercourse, not loud noise and not straining   Relieved by:  Nothing Worsened by:  Nothing Ineffective treatments:  Acetaminophen Associated symptoms: no abdominal pain, no back pain, no blurred vision, no congestion, no cough, no diarrhea, no dizziness, no drainage, no ear pain, no eye pain, no facial pain, no fatigue, no fever, no focal weakness, no hearing loss, no loss of balance, no myalgias, no nausea, no near-syncope, no neck pain, no neck stiffness, no numbness, no paresthesias, no photophobia, no seizures, no sinus pressure, no sore throat, no swollen glands, no syncope, no tingling, no URI, no visual change, no vomiting and no weakness   Risk factors: no anger, no family hx of SAH and does not have insomnia        Past Medical History:  Diagnosis Date  . Hiatal hernia   . Hypertension   . Substance abuse ([address])    ETOH/ stopped drinking nov. 2020    Patient Active Problem List   Diagnosis Date Noted  . Hypertension 09/08/2019    Past Surgical History:  Procedure Laterality Date  . NO PAST SURGERIES         Family History  Problem Relation Age of Onset  . Hypertension Mother   . Hyperlipidemia Mother   . Prostate cancer Father   . Esophageal cancer Neg  Hx   . Colon cancer Neg Hx   . Stomach cancer Neg Hx   . Pancreatic cancer Neg Hx     Social History   Tobacco Use  . Smoking status: Never Smoker  . Smokeless tobacco: Never Used  Vaping Use  . Vaping Use: Never used  Substance Use Topics  . Alcohol use: Not Currently  . Drug use: Not Currently    Home Medications Prior to Admission medications   Medication Sig Start Date End Date Taking? Authorizing Provider  celecoxib (CELEBREX) 200 MG capsule TAKE 1 CAPSULE (200 MG TOTAL) BY MOUTH DAILY AS NEEDED FOR PAIN 10/31/19   Charlott Rakes, MD  cetirizine (ZYRTEC) 10 MG tablet Take 1 tablet (10 mg total) by mouth daily. 09/26/19   Charlott Rakes, MD  losartan-hydrochlorothiazide (HYZAAR) 50-12.5 MG tablet Take 1 tablet by mouth daily. 09/26/19   Charlott Rakes, MD  methocarbamol (ROBAXIN) 500 MG tablet Take 1 tablet (500 mg total) by mouth at bedtime as needed for muscle spasms. 11/27/19   Caccavale, Sophia, PA-C  pantoprazole (PROTONIX) 40 MG tablet Take 1 tablet (40 mg total) by mouth 2 (two) times daily before a meal. 08/15/19   Nandigam, Venia Minks, MD  omeprazole (PRILOSEC) 20 MG capsule Take 1 capsule (20 mg total) by mouth daily. Patient not taking: Reported on 09/26/2019 07/15/19 01/10/20  Wyvonnia Dusky, MD  sucralfate (CARAFATE) 1 g tablet Take 1 tablet (1  g total) by mouth 4 (four) times daily -  with meals and at bedtime. Patient not taking: Reported on 09/26/2019 08/15/19 01/10/20  Mauri Pole, MD    Allergies    Patient has no known allergies.  Review of Systems   Review of Systems  Constitutional: Negative for fatigue and fever.  HENT: Negative for congestion, ear pain, hearing loss, postnasal drip, sinus pressure and sore throat.   Eyes: Negative for blurred vision, photophobia, pain and visual disturbance.  Respiratory: Negative for cough.   Cardiovascular: Negative.  Negative for syncope and near-syncope.  Gastrointestinal: Negative for abdominal pain, diarrhea,  nausea and vomiting.  Genitourinary: Negative.   Musculoskeletal: Negative for back pain, myalgias, neck pain and neck stiffness.  Neurological: Positive for headaches. Negative for dizziness, focal weakness, seizures, weakness, numbness, paresthesias and loss of balance.  Psychiatric/Behavioral: Negative for confusion.  All other systems reviewed and are negative.   Physical Exam Updated Vital Signs BP (!) 127/103   Pulse (!) 48   Temp 98.3 F (36.8 C) (Oral)   Resp 18   Ht 5\' 7"  (1.702 m)   Wt 114.3 kg   SpO2 97%   BMI 39.47 kg/m   Physical Exam Vitals and nursing note reviewed.  Constitutional:      General: He is not in acute distress.    Appearance: He is well-developed. He is not diaphoretic.  HENT:     Head: Normocephalic and atraumatic.  Eyes:     General: No scleral icterus.    Conjunctiva/sclera: Conjunctivae normal.     Pupils: Pupils are equal, round, and reactive to light.     Comments: No horizontal, vertical or rotational nystagmus  Neck:     Comments: Full active and passive ROM without pain No midline or paraspinal tenderness No nuchal rigidity or meningeal signs Cardiovascular:     Rate and Rhythm: Normal rate and regular rhythm.  Pulmonary:     Effort: Pulmonary effort is normal. No respiratory distress.     Breath sounds: Normal breath sounds. No wheezing or rales.  Abdominal:     General: Bowel sounds are normal.     Palpations: Abdomen is soft.     Tenderness: There is no abdominal tenderness. There is no guarding or rebound.  Musculoskeletal:        General: Normal range of motion.     Cervical back: Normal range of motion and neck supple.  Lymphadenopathy:     Cervical: No cervical adenopathy.  Skin:    General: Skin is warm and dry.     Findings: No rash.  Neurological:     Mental Status: He is alert and oriented to person, place, and time.     Cranial Nerves: No cranial nerve deficit.     Motor: No abnormal muscle tone.      Coordination: Coordination normal.     Comments: Mental Status:  Alert, oriented, thought content appropriate. Speech fluent without evidence of aphasia. Able to follow 2 step commands without difficulty.  Cranial Nerves:  II:  Peripheral visual fields grossly normal, pupils equal, round, reactive to light III,IV, VI: ptosis not present, extra-ocular motions intact bilaterally  V,VII: smile symmetric, facial light touch sensation equal VIII: hearing grossly normal bilaterally  IX,X: midline uvula rise  XI: bilateral shoulder shrug equal and strong XII: midline tongue extension  Motor:  5/5 in upper and lower extremities bilaterally including strong and equal grip strength and dorsiflexion/plantar flexion Sensory: Pinprick and light touch normal in all  extremities.  Cerebellar: normal finger-to-nose with bilateral upper extremities Gait: normal gait and balance CV: distal pulses palpable throughout   Psychiatric:        Behavior: Behavior normal.        Thought Content: Thought content normal.        Judgment: Judgment normal.     ED Results / Procedures / Treatments   Labs (all labs ordered are listed, but only abnormal results are displayed) Labs Reviewed  BASIC METABOLIC PANEL - Abnormal; Notable for the following components:      Result Value   Glucose, Bld 101 (*)    Calcium 8.7 (*)    All other components within normal limits  CBC - Abnormal; Notable for the following components:   Hemoglobin 12.9 (*)    All other components within normal limits    EKG None  Radiology CT HEAD WO CONTRAST  Result Date: 01/22/2020 CLINICAL DATA:  Headaches for 3 days, no known injury, initial encounter EXAM: CT HEAD WITHOUT CONTRAST TECHNIQUE: Contiguous axial images were obtained from the base of the skull through the vertex without intravenous contrast. COMPARISON:  None. FINDINGS: Brain: No evidence of acute infarction, hemorrhage, hydrocephalus, extra-axial collection or mass  lesion/mass effect. Vascular: No hyperdense vessel or unexpected calcification. Skull: Normal. Negative for fracture or focal lesion. Sinuses/Orbits: No acute finding. Other: Focal calcifications are noted along the falx and along the posterior aspect of the right frontal lobe consistent with small calcified meningiomas. IMPRESSION: Small areas of dural based calcification consistent with meningiomas. None of these exhibit any significant mass effect. No acute abnormality noted. Electronically Signed   By: Inez Catalina M.D.   On: 01/22/2020 22:07    Procedures Procedures (including critical care time)  Medications Ordered in ED Medications  sodium chloride 0.9 % bolus 500 mL (0 mLs Intravenous Stopped 01/22/20 2249)  diphenhydrAMINE (BENADRYL) injection 12.5 mg (12.5 mg Intravenous Given 01/22/20 2142)  prochlorperazine (COMPAZINE) injection 5 mg (5 mg Intravenous Given 01/22/20 2143)  ketorolac (TORADOL) 30 MG/ML injection 15 mg (15 mg Intravenous Given 01/22/20 2141)    ED Course  I have reviewed the triage vital signs and the nursing notes.  Pertinent labs & imaging results that were available during my care of the patient were reviewed by me and considered in my medical decision making (see chart for details).    MDM Rules/Calculators/A&P                          Hannibal Skalla presents with headache Given the large differential diagnosis for Desiree Hane, the decision making in this case is of high complexity.  After evaluating all of the data points in this case, the presentation of Delphin Funes is NOT consistent with skull fracture, meningitis/encephalitis, SAH/sentinel bleed, Intracranial Hemorrhage (ICH) (subdural/epidural), acute obstructive hydrocephalus, space occupying lesions, CVA, CO Poisoning, Basilar/vertebral artery dissection, preeclampsia, cerebral venous thrombosis, hypertensive emergency, temporal Arteritis, Idiopathic Intracranial Hypertension (pseudotumor  cerebri).  I ordered and interpreted labs that included bmp and CBC without Sig or emergent abnormalities.  I ordered and reviewed images of CT head which shoe calcifications of the dura consistent with calcified meningiomas without mass effect. I have low suspicion that these are the cause of his headache.  His BP was soft and do suspect some aspect of dehydration or overmedication with antihypertensives.  Headache treated and improved in the ED with fluids, compazine, benadryl, and Toradol.  OP f/u with neurology and PCP for headaches  and BP mgmt respectively.  Strict return and follow-up precautions have been given by me personally or by detailed written instructions verbalized by nursing staff using the teach back method to patient/family/caregiver.  Data Reviewed/Counseling: I have reviewed the patient's vital signs, nursing notes, and other relevant tests/information. I had a detailed discussion regarding the historical points, exam findings, and any diagnostic results supporting the discharge diagnosis. I also discussed the need for outpatient follow-up and the need to return to the ED if symptoms worsen or if there are any questions or concerns that arise at [facility]  Final Clinical Impression(s) / ED Diagnoses Final diagnoses:  Bad headache  Calcified cerebral meningioma [facility])    Rx / DC Orders ED Discharge Orders    None       Margarita Mail, PA-C 01/23/20 Groom, [place], MD 01/23/20 1853

## 2020-01-22 NOTE — ED Triage Notes (Signed)
Pt arrived via EMS. Pt states that he has had a headache off and on for the past 3 days. Pt has hx of hypertension, and thinks that the headache may be stemming from his hypertension.

## 2020-01-24 ENCOUNTER — Ambulatory Visit: Payer: Self-pay | Admitting: *Deleted

## 2020-01-24 NOTE — Telephone Encounter (Signed)
Pt called in c/o having a headache for a couple of days.  His BP is 154/91 this morning.  He went to the ED on 7//10/2019 for it.  A CT scan and evaluation was done.  He was told to f/u with his PCP.  He needs a ED f/u appt.  I called the flow coordinator at [place].   I wasn't able to schedule him due to a computer issue and I did not want to keep him on hold any longer.   They scheduled him for me.  See triage notes.  I sent my notes to [facility] and wellness.    Reason for Disposition . [1] MODERATE headache (e.g., interferes with normal activities) AND [2] present > 24 hours AND [3] unexplained  (Exceptions: analgesics not tried, typical migraine, or headache part of viral illness)    Seen in ED on 01/24/2020 for headache.  Answer Assessment - Initial Assessment Questions 1. LOCATION: "Where does it hurt?"      I have a headache on the top of my head 2. ONSET: "When did the headache start?" (Minutes, hours or days)      For a couple of days now. 3. PATTERN: "Does the pain come and go, or has it been constant since it started?"     Constant 4. SEVERITY: "How bad is the pain?" and "What does it keep you from doing?"  (e.g., Scale 1-10; mild, moderate, or severe)   - MILD (1-3): doesn't interfere with normal activities    - MODERATE (4-7): interferes with normal activities or awakens from sleep    - SEVERE (8-10): excruciating pain, unable to do any normal activities        "11" 5. RECURRENT SYMPTOM: "Have you ever had headaches before?" If Yes, ask: "When was the last time?" and "What happened that time?"      No 6. CAUSE: "What do you think is causing the headache?"     I don't know.  She gave me some medicine and it's not working for my BP.  It was a while back.  7. MIGRAINE: "Have you been diagnosed with migraine headaches?" If Yes, ask: "Is this headache similar?"      Never 8. HEAD INJURY: "Has there been any recent injury to the head?"      I fell  at work the other day.   I injured my back and shoulder I've been seen for that. 9. OTHER SYMPTOMS: "Do you have any other symptoms?" (fever, stiff neck, eye pain, sore throat, cold symptoms)     BP 154/91 this morning when I first got up around 7:30 AM.  It's not usually this high.  I bought the BP machine at [employer].   I took my BP medicine at 7:30 AM. This morning. I went to the emergency room yesterday because my head was hurting.  They never told me what was wrong.   They wanted me to follow up with my doctor. 10. PREGNANCY: "Is there any chance you are pregnant?" "When was your last menstrual period?"       N/A  Protocols used: HEADACHE-A-AH

## 2020-02-01 NOTE — Progress Notes (Deleted)
Patient ID: Fernando Cook, male   DOB: [birth year], 54 y.o.   MRN: [id number]   After ED visit 7/4 2021 for HA.  From ED note: After evaluating all of the data points in this case, the presentation of [name] is NOT consistent with skull fracture, meningitis/encephalitis, SAH/sentinel bleed, Intracranial Hemorrhage (ICH) (subdural/epidural), acute obstructive hydrocephalus, space occupying lesions, CVA, CO Poisoning, Basilar/vertebral artery dissection, preeclampsia, cerebral venous thrombosis, hypertensive emergency, temporal Arteritis, Idiopathic Intracranial Hypertension (pseudotumor cerebri).  I ordered and interpreted labs that included bmp and CBC without Sig or emergent abnormalities.  I ordered and reviewed images of CT head which shoe calcifications of the dura consistent with calcified meningiomas without mass effect. I have low suspicion that these are the cause of his headache.  His BP was soft and do suspect some aspect of dehydration or overmedication with antihypertensives.  Headache treated and improved in the ED with fluids, compazine, benadryl, and Toradol.  OP f/u with neurology and PCP for headaches and BP mgmt respectively.

## 2020-02-08 ENCOUNTER — Ambulatory Visit: Payer: Self-pay | Attending: Physician Assistant | Admitting: Physician Assistant

## 2020-02-08 ENCOUNTER — Other Ambulatory Visit: Payer: Self-pay

## 2020-02-22 NOTE — Progress Notes (Signed)
Patient ID: Fernando Cook, male   DOB: 1965/12/29, 54 y.o.   MRN: [id number]     Fernando Cook, is a 54 y.o. male  CZY:[id number]  UXN:[id number]  DOB - [birth year]  Subjective:  Chief Complaint and HPI: Fernando Cook is a 54 y.o. male here today to establish care and for a follow up visit After ED visit for HA 01/22/2020.  Of note, it sounds as though he is taking excedrin generic for his HA which contains caffeine.  He did recently buy a BP cuff.  BP not controlled on losartan?HCT50/12.5 which he wants to change.  HA are daily and have been occurring over a year  Also c/o lump on R wrist and R wrist pain.    Still has pain from previous work injury.  Needs Rf celebrex and pantoprazole. '  Also c/o weight gain over the last year  From Ed note: After evaluating all of the data points in this case, the presentation of Fernando Cook is NOT consistent with skull fracture, meningitis/encephalitis, SAH/sentinel bleed, Intracranial Hemorrhage (ICH) (subdural/epidural), acute obstructive hydrocephalus, space occupying lesions, CVA, CO Poisoning, Basilar/vertebral artery dissection, preeclampsia, cerebral venous thrombosis, hypertensive emergency, temporal Arteritis, Idiopathic Intracranial Hypertension (pseudotumor cerebri).  I ordered and interpreted labs that included bmp and CBC without Sig or emergent abnormalities.  I ordered and reviewed images of CT head which shoe calcifications of the dura consistent with calcified meningiomas without mass effect. I have low suspicion that these are the cause of his headache.  His BP was soft and do suspect some aspect of dehydration or overmedication with antihypertensives.  Headache treated and improved in the ED with fluids, compazine, benadryl, and Toradol.  OP f/u with neurology and PCP for headaches and BP mgmt respectively.  Strict return and follow-up precautions have been given by me personally or by detailed written  instructions verbalized by nursing staff using the teach back method to patient/family/caregiver.  Data Reviewed/Counseling: I have reviewed the patient's vital signs, nursing notes, and other relevant tests/information. I had a detailed discussion regarding the historical points, exam findings, and any diagnostic results supporting the discharge diagnosis. I also discussed the need for outpatient follow-up and the need to return to the ED if symptoms worsen or if there are any questions or concerns that arise at [facility]   ED/Hospital notes reviewed and summarized above.     ROS:   Constitutional:  No f/c, No night sweats, No unexplained weight loss. EENT:  No vision changes, No blurry vision, No hearing changes. No mouth, throat, or ear problems.  Respiratory: No cough, No SOB Cardiac: No CP, no palpitations GI:  No abd pain, No N/V/D. GU: No Urinary s/sx Musculoskeletal: R knee from injury at work Neuro: + headache, no dizziness, no motor weakness.  Skin: No rash Endocrine:  No polydipsia. No polyuria.  Psych: Denies SI/HI  No problems updated.  ALLERGIES: No Known Allergies  PAST MEDICAL HISTORY: Past Medical History:  Diagnosis Date   Hiatal hernia    Hypertension    Substance abuse ([place])    ETOH/ stopped drinking nov. 2020    MEDICATIONS AT HOME: Prior to Admission medications   Medication Sig Start Date End Date Taking? Authorizing Provider  cetirizine (ZYRTEC) 10 MG tablet Take 1 tablet (10 mg total) by mouth daily. 09/26/19  Yes Charlott Rakes, MD  pantoprazole (PROTONIX) 40 MG tablet Take 1 tablet (40 mg total) by mouth 2 (two) times daily before a meal. 02/29/20  Yes Lupe Handley, Dionne Bucy, PA-C  celecoxib (CELEBREX) 200 MG capsule TAKE 1 CAPSULE (200 MG TOTAL) BY MOUTH DAILY AS NEEDED FOR PAIN 02/29/20   Argentina Donovan, PA-C  losartan-hydrochlorothiazide (HYZAAR) 100-25 MG tablet Take 1 tablet by mouth daily. 02/29/20   Argentina Donovan, PA-C  omeprazole (PRILOSEC) 20  MG capsule Take 1 capsule (20 mg total) by mouth daily. Patient not taking: Reported on 09/26/2019 07/15/19 01/10/20  Wyvonnia Dusky, MD  sucralfate (CARAFATE) 1 g tablet Take 1 tablet (1 g total) by mouth 4 (four) times daily -  with meals and at bedtime. Patient not taking: Reported on 09/26/2019 08/15/19 01/10/20  Mauri Pole, MD     Objective:  EXAM:   Vitals:   02/29/20 1343  BP: (!) 131/91  Pulse: 75  Resp: 17  Temp: 97.9 F (36.6 C)  TempSrc: Temporal  SpO2: 95%  Weight: 259 lb (117.5 kg)    General appearance : A&OX3. NAD. Non-toxic-appearing HEENT: Atraumatic and Normocephalic.  PERRLA. EOM intact.  Neck: supple, no JVD. No cervical lymphadenopathy. No thyromegaly Chest/Lungs:  Breathing-non-labored, Good air entry bilaterally, breath sounds normal without rales, rhonchi, or wheezing  CVS: S1 S2 regular, no murmurs, gallops, rubs  Abdomen: Bowel sounds present, Non tender and not distended with no gaurding, rigidity or rebound. Extremities: Bilateral Lower Ext shows no edema, both legs are warm to touch with = pulse throughout Neurology:  CN II-XII grossly intact, Non focal.   Psych:  TP linear. J/I WNL. Normal speech. Appropriate eye contact and affect.  Skin:  No Rash  Data Review No results found for: HGBA1C   Assessment & Plan   1. Essential hypertension Not controlled-will have him check BP daily since he has a BP cuff now.  Increase dose losartan/HCT.  Stop analgesics containing caffeine bc likely causing rebound HA.   - Comprehensive metabolic panel - losartan-hydrochlorothiazide (HYZAAR) 100-25 MG tablet; Take 1 tablet by mouth daily.  Dispense: 90 tablet; Refill: 3 We have discussed target BP range and blood pressure goal. I have advised patient to check BP regularly and to call us back or report to clinic if the numbers are consistently higher than 140/90. We discussed the importance of compliance with medical therapy and DASH diet recommended,  consequences of uncontrolled hypertension discussed.    2. Right wrist pain Caused by #3 - Ambulatory referral to Hand Surgery  3. Ganglion cyst - Ambulatory referral to Hand Surgery  4. Weight gain Diet, exercise - TSH - Vitamin D, 25-hydroxy - Hemoglobin A1c  5. Encounter for examination following treatment at hospital  6. Nonintractable headache, unspecified chronicity pattern, unspecified headache type Likely combination rebound HA from analgesics containing caffeine-begin to taper off these  7. Screening for diabetes mellitus I have had a lengthy discussion and provided education about insulin resistance and the intake of too much sugar/refined carbohydrates.  I have advised the patient to work at a goal of eliminating sugary drinks, candy, desserts, sweets, refined sugars, processed foods, and white carbohydrates.  The patient expresses understanding.   - Hemoglobin A1c     Patient have been counseled extensively about nutrition and exercise  Return in about 3 weeks (around 03/21/2020) for BP check with Luke-3 months with PCP.  The patient was given clear instructions to go to ER or return to medical center if symptoms don't improve, worsen or new problems develop. The patient verbalized understanding. The patient was told to call to get lab results if they haven't heard anything in the next week.  Freeman Caldron, PA-C [facility] and [place], [place]   02/29/2020, 2:20 PM

## 2020-02-29 ENCOUNTER — Ambulatory Visit: Payer: Self-pay | Attending: Physician Assistant | Admitting: Physician Assistant

## 2020-02-29 ENCOUNTER — Other Ambulatory Visit: Payer: Self-pay | Admitting: Physician Assistant

## 2020-02-29 ENCOUNTER — Encounter: Payer: Self-pay | Admitting: Physician Assistant

## 2020-02-29 ENCOUNTER — Other Ambulatory Visit: Payer: Self-pay

## 2020-02-29 VITALS — BP 131/91 | HR 75 | Temp 97.9°F | Resp 17 | Wt 259.0 lb

## 2020-02-29 DIAGNOSIS — M6740 Ganglion, unspecified site: Secondary | ICD-10-CM

## 2020-02-29 DIAGNOSIS — R519 Headache, unspecified: Secondary | ICD-10-CM

## 2020-02-29 DIAGNOSIS — Z09 Encounter for follow-up examination after completed treatment for conditions other than malignant neoplasm: Secondary | ICD-10-CM

## 2020-02-29 DIAGNOSIS — R635 Abnormal weight gain: Secondary | ICD-10-CM

## 2020-02-29 DIAGNOSIS — M25531 Pain in right wrist: Secondary | ICD-10-CM

## 2020-02-29 DIAGNOSIS — I10 Essential (primary) hypertension: Secondary | ICD-10-CM

## 2020-02-29 DIAGNOSIS — Z131 Encounter for screening for diabetes mellitus: Secondary | ICD-10-CM

## 2020-02-29 MED ORDER — PANTOPRAZOLE SODIUM 40 MG PO TBEC: 40.0000 mg | DELAYED_RELEASE_TABLET | Freq: Two times a day (BID) | ORAL | 3 refills | Status: DC

## 2020-02-29 MED ORDER — CELECOXIB 200 MG PO CAPS: ORAL_CAPSULE | ORAL | 1 refills | Status: DC

## 2020-02-29 MED ORDER — LOSARTAN POTASSIUM-HCTZ 100-25 MG PO TABS: 1.0000 | ORAL_TABLET | Freq: Every day | ORAL | 3 refills | Status: DC

## 2020-02-29 MED FILL — LOSARTAN-HCTZ 100-25 MG TAB: 100-25 | 30 days supply | Qty: 30 | Fill #0

## 2020-02-29 MED FILL — PANTOPRAZOLE SOD DR 40 MG T: 40 | 30 days supply | Qty: 60 | Fill #0

## 2020-02-29 MED FILL — CELECOXIB 200 MG CAP: 200 | 30 days supply | Qty: 30 | Fill #0

## 2020-02-29 NOTE — Patient Instructions (Signed)
Check blood pressure daily and record and keep BP log.  Cut back on caffeine and caffeine containing headache meds to avoid rebound headaches   Analgesic Rebound Headache An analgesic rebound headache, sometimes called a medication overuse headache, is a headache that comes after pain medicine (analgesic) taken to treat the original (primary) headache has worn off. Any type of primary headache can return as a rebound headache if a person regularly takes analgesics more than three times a week to treat it. The types of primary headaches that are commonly associated with rebound headaches include:  Migraines.  Headaches that arise from tense muscles in the head and neck area (tension headaches).  Headaches that develop and happen again (recur) on one side of the head and around the eye (cluster headaches). If rebound headaches continue, they become chronic daily headaches. What are the causes? This condition may be caused by frequent use of:  Over-the-counter medicines such as aspirin, ibuprofen, and acetaminophen.  Sinus relief medicines and other medicines that contain caffeine.  Narcotic pain medicines such as codeine and oxycodone. What are the signs or symptoms? The symptoms of a rebound headache are the same as the symptoms of the original headache. Some of the symptoms of specific types of headaches include: Migraine headache  Pulsing or throbbing pain on one or both sides of the head.  Severe pain that interferes with daily activities.  Pain that is worsened by physical activity.  Nausea, vomiting, or both.  Pain with exposure to bright light, loud noises, or strong smells.  General sensitivity to bright light, loud noises, or strong smells.  Visual changes.  Numbness of one or both arms. Tension headache  Pressure around the head.  Dull, aching head pain.  Pain felt over the front and sides of the head.  Tenderness in the muscles of the head, neck, and  shoulders. Cluster headache  Severe pain that begins in or around one eye or temple.  Redness and tearing in the eye on the same side as the pain.  Droopy or swollen eyelid.  One-sided head pain.  Nausea.  Runny nose.  Sweaty, pale facial skin.  Restlessness. How is this diagnosed? This condition is diagnosed by:  Reviewing your medical history. This includes the nature of your primary headaches.  Reviewing the types of pain medicines that you have been using to treat your headaches and how often you take them. How is this treated? This condition may be treated or managed by:  Discontinuing frequent use of the analgesic medicine. Doing this may worsen your headaches at first, but the pain should eventually become more manageable, less frequent, and less severe.  Seeing a headache specialist. He or she may be able to help you manage your headaches and help make sure there is not another cause of the headaches.  Using methods of stress relief, such as acupuncture, counseling, biofeedback, and massage. Talk with your health care provider about which methods might be good for you. Follow these instructions at home:  Take over-the-counter and prescription medicines only as told by your health care provider.  Stop the repeated use of pain medicine as told by your health care provider. Stopping can be difficult. Carefully follow instructions from your health care provider.  Avoid triggers that are known to cause your primary headaches.  Keep all follow-up visits as told by your health care provider. This is important. Contact a health care provider if:  You continue to experience headaches after following treatments that your health  care provider recommended. Get help right away if:  You develop new headache pain.  You develop headache pain that is different than what you have experienced in the past.  You develop numbness or tingling in your arms or legs.  You develop  changes in your speech or vision. This information is not intended to replace advice given to you by your health care provider. Make sure you discuss any questions you have with your health care provider. Document Revised: 06/19/2017 Document Reviewed: 12/10/2015 Elsevier Patient Education  2020 [employer].

## 2020-03-01 ENCOUNTER — Other Ambulatory Visit: Payer: Self-pay | Admitting: Physician Assistant

## 2020-03-01 DIAGNOSIS — E559 Vitamin D deficiency, unspecified: Secondary | ICD-10-CM

## 2020-03-01 LAB — HEMOGLOBIN A1C
Est. average glucose Bld gHb Est-mCnc: 126 mg/dL
Hgb A1c MFr Bld: 6 % — ABNORMAL HIGH (ref 4.8–5.6)

## 2020-03-01 LAB — COMPREHENSIVE METABOLIC PANEL
ALT: 30 IU/L (ref 0–44)
AST: 25 IU/L (ref 0–40)
Albumin/Globulin Ratio: 1.2 (ref 1.2–2.2)
Albumin: 3.9 g/dL (ref 3.8–4.9)
Alkaline Phosphatase: 95 IU/L (ref 48–121)
BUN/Creatinine Ratio: 11 (ref 9–20)
BUN: 9 mg/dL (ref 6–24)
Bilirubin Total: 0.4 mg/dL (ref 0.0–1.2)
CO2: 25 mmol/L (ref 20–29)
Calcium: 10 mg/dL (ref 8.7–10.2)
Chloride: 100 mmol/L (ref 96–106)
Creatinine, Ser: 0.8 mg/dL (ref 0.76–1.27)
GFR calc Af Amer: 115 mL/min/{1.73_m2} (ref >59)
GFR calc non Af Amer: 100 mL/min/{1.73_m2} (ref >59)
Globulin, Total: 3.3 g/dL (ref 1.5–4.5)
Glucose: 100 mg/dL — ABNORMAL HIGH (ref 65–99)
Potassium: 4.6 mmol/L (ref 3.5–5.2)
Sodium: 138 mmol/L (ref 134–144)
Total Protein: 7.2 g/dL (ref 6.0–8.5)

## 2020-03-01 LAB — TSH: TSH: 1.25 u[IU]/mL (ref 0.450–4.500)

## 2020-03-01 LAB — VITAMIN D 25 HYDROXY (VIT D DEFICIENCY, FRACTURES): Vit D, 25-Hydroxy: 13.7 ng/mL — ABNORMAL LOW (ref 30.0–100.0)

## 2020-03-01 MED ORDER — VITAMIN D (ERGOCALCIFEROL) 1.25 MG (50000 UNIT) PO CAPS: 50000.0000 [IU] | ORAL_CAPSULE | ORAL | 0 refills | Status: DC

## 2020-04-09 ENCOUNTER — Other Ambulatory Visit: Payer: Self-pay | Admitting: Family Medicine

## 2020-04-09 DIAGNOSIS — I10 Essential (primary) hypertension: Secondary | ICD-10-CM

## 2020-04-09 MED ORDER — CELECOXIB 200 MG PO CAPS: ORAL_CAPSULE | ORAL | 2 refills | Status: DC

## 2020-04-09 MED ORDER — LOSARTAN POTASSIUM-HCTZ 100-25 MG PO TABS: 1.0000 | ORAL_TABLET | Freq: Every day | ORAL | 1 refills | Status: DC

## 2020-04-09 MED FILL — CELECOXIB 200 MG CAP: 200 | 30 days supply | Qty: 30 | Fill #0

## 2020-04-09 MED FILL — LOSARTAN-HCTZ 100-25 MG TAB: 100-25 | 30 days supply | Qty: 30 | Fill #0

## 2020-04-09 NOTE — Telephone Encounter (Signed)
Medication Refill - Medication: losartan, celecoxib  Has the patient contacted their pharmacy? Yes.   (Agent: If no, request that the patient contact the pharmacy for the refill.) (Agent: If yes, when and what did the pharmacy advise?)  Preferred Pharmacy (with phone number or street name):  [facility], [address] [address]  [place] [address]  Phone: [phone] Fax: [phone]  Hours: Not open 24 hours     Agent: Please be advised that RX refills may take up to 3 business days. We ask that you follow-up with your pharmacy.

## 2020-04-09 NOTE — Telephone Encounter (Signed)
Pt states that he is completely out of medication. Please advise.

## 2020-04-30 ENCOUNTER — Encounter (HOSPITAL_COMMUNITY): Payer: Self-pay

## 2020-04-30 ENCOUNTER — Other Ambulatory Visit: Payer: Self-pay

## 2020-04-30 ENCOUNTER — Emergency Department (HOSPITAL_COMMUNITY)
Admission: EM | Admit: 2020-04-30 | Discharge: 2020-04-30 | Disposition: A | Discharge status: Home / Self Care | Payer: Self-pay | Attending: Emergency Medicine | Admitting: Emergency Medicine

## 2020-04-30 DIAGNOSIS — I10 Essential (primary) hypertension: Secondary | ICD-10-CM | POA: Insufficient documentation

## 2020-04-30 DIAGNOSIS — Z79899 Other long term (current) drug therapy: Secondary | ICD-10-CM | POA: Insufficient documentation

## 2020-04-30 DIAGNOSIS — K439 Ventral hernia without obstruction or gangrene: Secondary | ICD-10-CM | POA: Insufficient documentation

## 2020-04-30 NOTE — ED Provider Notes (Signed)
[facility] DEPT Provider Note   CSN: [id number] Arrival date & time: 04/30/20  1132     History Chief Complaint  Patient presents with  . Umbilical Hernia    Fernando Cook is a 54 y.o. male.  Patient is a 54 year old male with a history of hypertension who is presenting today with a complaint of a swelling in his abdomen.  Patient reports that he noticed this a few days ago but there is a knot in his abdomen that is soft and squishy above his bellybutton.  He has had no pain in this area and does not notice that anything makes it better or worse.  He is able to push on it and reports sometimes it gurgles.  He has had no change in bowel or bladder habits.  He has been able to eat and drink normally.  He used to have a job at [employer] where he did significant lifting but has been on light duty since June.  The history is provided by the patient.       Past Medical History:  Diagnosis Date  . Hiatal hernia   . Hypertension   . Substance abuse ([place])    ETOH/ stopped drinking nov. 2020    Patient Active Problem List   Diagnosis Date Noted  . Hypertension 09/08/2019    Past Surgical History:  Procedure Laterality Date  . NO PAST SURGERIES         Family History  Problem Relation Age of Onset  . Hypertension Mother   . Hyperlipidemia Mother   . Prostate cancer Father   . Esophageal cancer Neg Hx   . Colon cancer Neg Hx   . Stomach cancer Neg Hx   . Pancreatic cancer Neg Hx     Social History   Tobacco Use  . Smoking status: Never Smoker  . Smokeless tobacco: Never Used  Vaping Use  . Vaping Use: Never used  Substance Use Topics  . Alcohol use: Not Currently  . Drug use: Not Currently    Home Medications Prior to Admission medications   Medication Sig Start Date End Date Taking? Authorizing Provider  celecoxib (CELEBREX) 200 MG capsule TAKE 1 CAPSULE (200 MG TOTAL) BY MOUTH DAILY AS NEEDED FOR PAIN 04/09/20   Charlott Rakes, MD  cetirizine (ZYRTEC) 10 MG tablet Take 1 tablet (10 mg total) by mouth daily. 09/26/19   Charlott Rakes, MD  losartan-hydrochlorothiazide (HYZAAR) 100-25 MG tablet Take 1 tablet by mouth daily. 04/09/20   Charlott Rakes, MD  pantoprazole (PROTONIX) 40 MG tablet Take 1 tablet (40 mg total) by mouth 2 (two) times daily before a meal. 02/29/20   McClung, Dionne Bucy, PA-C  Vitamin D, Ergocalciferol, (DRISDOL) 1.25 MG (50000 UNIT) CAPS capsule Take 1 capsule (50,000 Units total) by mouth every 7 (seven) days. 03/01/20   Argentina Donovan, PA-C  omeprazole (PRILOSEC) 20 MG capsule Take 1 capsule (20 mg total) by mouth daily. Patient not taking: Reported on 09/26/2019 07/15/19 01/10/20  Wyvonnia Dusky, MD  sucralfate (CARAFATE) 1 g tablet Take 1 tablet (1 g total) by mouth 4 (four) times daily -  with meals and at bedtime. Patient not taking: Reported on 09/26/2019 08/15/19 01/10/20  Mauri Pole, MD    Allergies    Patient has no known allergies.  Review of Systems   Review of Systems  All other systems reviewed and are negative.   Physical Exam Updated Vital Signs BP (!) 154/96   Pulse  70   Temp 98.3 F (36.8 C) (Oral)   Resp 19   SpO2 99%   Physical Exam Vitals and nursing note reviewed.  Constitutional:      General: He is not in acute distress.    Appearance: Normal appearance. He is obese.  HENT:     Head: Normocephalic.  Eyes:     Pupils: Pupils are equal, round, and reactive to light.  Cardiovascular:     Rate and Rhythm: Normal rate.  Pulmonary:     Effort: Pulmonary effort is normal.  Abdominal:     General: Bowel sounds are normal.     Tenderness: There is no abdominal tenderness.     Hernia: A hernia is present. Hernia is present in the ventral area.    Skin:    General: Skin is warm and dry.     Capillary Refill: Capillary refill takes less than 2 seconds.  Neurological:     Mental Status: He is alert. Mental status is at baseline.  Psychiatric:          Mood and Affect: Mood normal.        Behavior: Behavior normal.        Thought Content: Thought content normal.     ED Results / Procedures / Treatments   Labs (all labs ordered are listed, but only abnormal results are displayed) Labs Reviewed - No data to display  EKG None  Radiology No results found.  Procedures Procedures (including critical care time)  Medications Ordered in ED Medications - No data to display  ED Course  I have reviewed the triage vital signs and the nursing notes.  Pertinent labs & imaging results that were available during my care of the patient were reviewed by me and considered in my medical decision making (see chart for details).    MDM Rules/Calculators/A&P                          Patient presenting today with symptoms most consistent with a reducible ventral hernia.  Patient has no associated symptoms of pain and no prior surgeries.  Patient given return precautions if hernia becomes incarcerated.  Gave follow-up with [facility] of area begins to become worse but patient also could just do watchful waiting as well.  Final Clinical Impression(s) / ED Diagnoses Final diagnoses:  Ventral hernia without obstruction or gangrene    Rx / DC Orders ED Discharge Orders    None       Blanchie Dessert, MD 04/30/20 1241

## 2020-04-30 NOTE — Discharge Instructions (Addendum)
If the area starts bulging and becomes very hard and tender and you cannot get it to call back in should return to the emergency room.  As you do not have to do anything special with this hernia as long as it remains soft and nontender.

## 2020-04-30 NOTE — ED Triage Notes (Signed)
Pt c/o soft bulge above belly button. Denies pain or hx. Noticed 2 days ago.  A/o Ambulatory in triage

## 2020-05-09 MED FILL — LOSARTAN-HCTZ 100-25 MG TAB: 100-25 | 30 days supply | Qty: 30 | Fill #1

## 2020-05-11 ENCOUNTER — Telehealth: Payer: Self-pay | Admitting: Family Medicine

## 2020-05-11 NOTE — Telephone Encounter (Signed)
Copied from [place] [id number]. Topic: Referral - Status >> May 09, 2020  3:16 PM Lennox Solders wrote: Reason for CRM: Pt is calling checking on the status of referral to hand surgeon for his right wrist pain

## 2020-05-11 NOTE — Telephone Encounter (Signed)
On 03/01/2020 a letter was mailed  patient need to apply  for cafa .  Pt do not have insurance I send a letter to patient to apply for the [employer] discount CAFA with application  to be refer to an Orthopedics specialist. I Called patient and I was unable to talk to him .

## 2020-06-18 ENCOUNTER — Other Ambulatory Visit: Payer: Self-pay | Admitting: Family Medicine

## 2020-06-18 ENCOUNTER — Telehealth: Payer: Self-pay

## 2020-06-18 DIAGNOSIS — I10 Essential (primary) hypertension: Secondary | ICD-10-CM

## 2020-06-18 NOTE — Telephone Encounter (Signed)
Medication Refill - Medication: losartan- hydrochlorothiazide, celecoxib   Has the patient contacted their pharmacy? Yes.   (Agent: If no, request that the patient contact the pharmacy for the refill.) (Agent: If yes, when and what did the pharmacy advise?)  Preferred Pharmacy (with phone number or street name):  [facility], [address] [address]  [place] [address]  Phone: [phone] Fax: [phone]  Hours: Not open 24 hours     Agent: Please be advised that RX refills may take up to 3 business days. We ask that you follow-up with your pharmacy.

## 2020-06-18 NOTE — Telephone Encounter (Signed)
Please place hand surgeon referral if indicated!  Copied from [place] [id number]. Topic: Referral - Request for Referral >> Jun 18, 2020 10:51 AM Celene Kras wrote: Has patient seen PCP for this complaint? Yes.   *If NO, is insurance requiring patient see PCP for this issue before PCP can refer them? Referral for which specialty: Hand Surgery  Preferred provider/office: N/A Reason for referral: Pt states that he is having pain in his wrist. Please advise.

## 2020-06-19 ENCOUNTER — Other Ambulatory Visit: Payer: Self-pay | Admitting: Family Medicine

## 2020-06-19 MED ORDER — LOSARTAN POTASSIUM-HCTZ 100-25 MG PO TABS: 1.0000 | ORAL_TABLET | Freq: Every day | ORAL | 0 refills | Status: DC

## 2020-06-19 MED ORDER — CELECOXIB 200 MG PO CAPS: ORAL_CAPSULE | ORAL | 0 refills | Status: DC

## 2020-06-19 MED FILL — CELECOXIB 200 MG CAP: 200 | 30 days supply | Qty: 30 | Fill #0

## 2020-06-19 MED FILL — LOSARTAN-HCTZ 100-25 MG TAB: 100-25 | 30 days supply | Qty: 30 | Fill #0

## 2020-06-19 NOTE — Telephone Encounter (Signed)
Patient is uninsured  .  Mailed a letter to patient with application  for [employer]  on 03/01/2020 . Unable to contact patient

## 2020-06-19 NOTE — Telephone Encounter (Signed)
Referrals were placed in 08/2019 and again in 02/2020 and both were closed.  Please refer to my last notes for reason for closure of first referral in 08/2019.  Please check with referral coordinator and inform patient accordingly.  Thank you

## 2020-06-21 NOTE — Telephone Encounter (Signed)
Thank you so much Alinda Sierras!

## 2020-06-26 ENCOUNTER — Ambulatory Visit (HOSPITAL_COMMUNITY)
Admission: EM | Admit: 2020-06-26 | Discharge: 2020-06-26 | Disposition: A | Discharge status: Home / Self Care | Payer: Self-pay | Attending: Physician Assistant | Admitting: Physician Assistant

## 2020-06-26 ENCOUNTER — Other Ambulatory Visit: Payer: Self-pay

## 2020-06-26 ENCOUNTER — Encounter (HOSPITAL_COMMUNITY): Payer: Self-pay | Admitting: *Deleted

## 2020-06-26 DIAGNOSIS — R1013 Epigastric pain: Secondary | ICD-10-CM | POA: Insufficient documentation

## 2020-06-26 MED ORDER — BISMUTH SUBSALICYLATE 262 MG PO TABS: 524.0000 mg | ORAL_TABLET | Freq: Four times a day (QID) | ORAL | 0 refills | Status: AC

## 2020-06-26 MED ORDER — LIDOCAINE VISCOUS HCL 2 % MT SOLN
15.0000 mL | Freq: Once | OROMUCOSAL | Status: AC
  Administered 2020-06-26: 15 mL via ORAL

## 2020-06-26 MED ORDER — ALUM & MAG HYDROXIDE-SIMETH 200-200-20 MG/5ML PO SUSP
30.0000 mL | Freq: Once | ORAL | Status: AC
  Administered 2020-06-26: 30 mL via ORAL

## 2020-06-26 MED ORDER — HYOSCYAMINE SULFATE 0.125 MG SL SUBL: 0.2500 mg | SUBLINGUAL_TABLET | Freq: Once | SUBLINGUAL | Status: DC

## 2020-06-26 MED ORDER — LIDOCAINE VISCOUS HCL 2 % MT SOLN
OROMUCOSAL | Status: AC
  Filled 2020-06-26: qty 15

## 2020-06-26 MED ORDER — ALUM & MAG HYDROXIDE-SIMETH 200-200-20 MG/5ML PO SUSP
ORAL | Status: AC
  Filled 2020-06-26: qty 30

## 2020-06-26 MED FILL — PANTOPRAZOLE SOD DR 40 MG T: 40 | 30 days supply | Qty: 60 | Fill #1

## 2020-06-26 NOTE — ED Notes (Signed)
Patient refused to answer questions concerning covid exposure or testing

## 2020-06-26 NOTE — Discharge Instructions (Addendum)
Follow up with PCP for further evaluation of peptic ulcer

## 2020-06-26 NOTE — ED Triage Notes (Signed)
t reports he takes Pantoprazole 40 mg for his stomach ulcer. Pt wants a stronger medicine  For stomach. Pt call the DR about a change in meds but the office was to busy to see him.

## 2020-06-26 NOTE — ED Provider Notes (Signed)
[facility]    CSN: [id number] Arrival date & time: 06/26/20  1044      History   Chief Complaint Chief Complaint  Patient presents with  . Abdominal Pain    HPI Fernando Cook is a 54 y.o. male.   Patient here c/w epigastric "abdominal pain", he reports he has peptic ulcers and is taking 40 mg protonix BID.  He received 60 tabs on 02/2020 and has 2 tablets left.  He stated he is taking medication as prescribed, however, it is not helping.  Advised him to take medication as prescribed.  He has not been tested for h. Pylori.  Denies weight loss, hematochezia, melena, fatigue, n/v/d/c.       Past Medical History:  Diagnosis Date  . Hiatal hernia   . Hypertension   . Substance abuse ([place])    ETOH/ stopped drinking nov. 2020    Patient Active Problem List   Diagnosis Date Noted  . Hypertension 09/08/2019    Past Surgical History:  Procedure Laterality Date  . NO PAST SURGERIES         Home Medications    Prior to Admission medications   Medication Sig Start Date End Date Taking? Authorizing Provider  celecoxib (CELEBREX) 200 MG capsule TAKE 1 CAPSULE (200 MG TOTAL) BY MOUTH DAILY AS NEEDED FOR PAIN 06/19/20  Yes Charlott Rakes, MD  cetirizine (ZYRTEC) 10 MG tablet Take 1 tablet (10 mg total) by mouth daily. 09/26/19  Yes Newlin, Charlane Ferretti, MD  losartan-hydrochlorothiazide (HYZAAR) 100-25 MG tablet Take 1 tablet by mouth daily. 06/19/20  Yes Charlott Rakes, MD  pantoprazole (PROTONIX) 40 MG tablet Take 1 tablet (40 mg total) by mouth 2 (two) times daily before a meal. 02/29/20  Yes McClung, Angela M, PA-C  Vitamin D, Ergocalciferol, (DRISDOL) 1.25 MG (50000 UNIT) CAPS capsule Take 1 capsule (50,000 Units total) by mouth every 7 (seven) days. 03/01/20  Yes Argentina Donovan, PA-C  Bismuth Subsalicylate 536 MG TABS Take 2 tablets (524 mg total) by mouth every 6 (six) hours for 4 days. 06/26/20 06/30/20  Peri Jefferson, PA-C  omeprazole (PRILOSEC) 20 MG capsule  Take 1 capsule (20 mg total) by mouth daily. Patient not taking: Reported on 09/26/2019 07/15/19 01/10/20  Wyvonnia Dusky, MD  sucralfate (CARAFATE) 1 g tablet Take 1 tablet (1 g total) by mouth 4 (four) times daily -  with meals and at bedtime. Patient not taking: Reported on 09/26/2019 08/15/19 01/10/20  Mauri Pole, MD    Family History Family History  Problem Relation Age of Onset  . Hypertension Mother   . Hyperlipidemia Mother   . Prostate cancer Father   . Esophageal cancer Neg Hx   . Colon cancer Neg Hx   . Stomach cancer Neg Hx   . Pancreatic cancer Neg Hx     Social History Social History   Tobacco Use  . Smoking status: Never Smoker  . Smokeless tobacco: Never Used  Vaping Use  . Vaping Use: Never used  Substance Use Topics  . Alcohol use: Not Currently  . Drug use: Not Currently     Allergies   Patient has no known allergies.   Review of Systems Review of Systems  Constitutional: Negative for chills, fatigue, fever and unexpected weight change.  Gastrointestinal: Positive for abdominal distention and abdominal pain. Negative for anal bleeding, blood in stool, constipation, diarrhea, nausea and vomiting.  Musculoskeletal: Negative for arthralgias and myalgias.  Skin: Negative for rash.  Neurological: Negative for  light-headedness and headaches.  Hematological: Negative for adenopathy. Does not bruise/bleed easily.  Psychiatric/Behavioral: Negative for confusion and sleep disturbance.     Physical Exam Triage Vital Signs ED Triage Vitals  Enc Vitals Group     BP 06/26/20 1311 (!) 158/91     Pulse Rate 06/26/20 1311 64     Resp 06/26/20 1311 18     Temp 06/26/20 1311 98.3 F (36.8 C)     Temp Source 06/26/20 1311 Oral     SpO2 06/26/20 1311 99 %     Weight --      Height --      Head Circumference --      Peak Flow --      Pain Score 06/26/20 1313 8     Pain Loc --      Pain Edu? --      Excl. in [place]? --    No data found.  Updated  Vital Signs BP (!) 158/91 (BP Location: Left Arm)   Pulse 64   Temp 98.3 F (36.8 C) (Oral)   Resp 18   SpO2 99%   Visual Acuity Right Eye Distance:   Left Eye Distance:   Bilateral Distance:    Right Eye Near:   Left Eye Near:    Bilateral Near:     Physical Exam Vitals and nursing note reviewed.  Constitutional:      Appearance: Normal appearance. He is well-developed.  HENT:     Head: Normocephalic and atraumatic.     Nose: Nose normal.     Mouth/Throat:     Mouth: Mucous membranes are moist.  Eyes:     General: No scleral icterus.    Conjunctiva/sclera: Conjunctivae normal.  Pulmonary:     Effort: Pulmonary effort is normal. No respiratory distress.  Abdominal:     Palpations: Abdomen is soft.     Tenderness: There is abdominal tenderness in the epigastric area. There is no right CVA tenderness or left CVA tenderness. Negative signs include Murphy's sign, Rovsing's sign, McBurney's sign and psoas sign.  Musculoskeletal:     Cervical back: Normal range of motion and neck supple. No rigidity.  Skin:    General: Skin is warm and dry.     Capillary Refill: Capillary refill takes less than 2 seconds.  Neurological:     General: No focal deficit present.     Mental Status: He is alert and oriented to person, place, and time.     Motor: No weakness.     Gait: Gait normal.  Psychiatric:        Mood and Affect: Mood normal.        Behavior: Behavior normal.      UC Treatments / Results  Labs (all labs ordered are listed, but only abnormal results are displayed) Labs Reviewed  H. PYLORI ANTIBODY, IGG    EKG   Radiology No results found.  Procedures Procedures (including critical care time)  Medications Ordered in UC Medications  alum & mag hydroxide-simeth (MAALOX/MYLANTA) 200-200-20 MG/5ML suspension 30 mL (30 mLs Oral Given 06/26/20 1425)    And  lidocaine (XYLOCAINE) 2 % viscous mouth solution 15 mL (15 mLs Oral Given 06/26/20 1425)    Initial  Impression / Assessment and Plan / UC Course  I have reviewed the triage vital signs and the nursing notes.  Pertinent labs & imaging results that were available during my care of the patient were reviewed by me and considered in my medical decision making (  see chart for details).     Will test for H. Pylori Follow up with PCP Take medication as prescribed - he has 4 refills remaining on protonix, advised to get refill at pharmacy and take consistently as directed Will add several days of bismuth subsalicyate Final Clinical Impressions(s) / UC Diagnoses   Final diagnoses:  Epigastric pain     Discharge Instructions     Follow up with PCP for further evaluation of peptic ulcer    ED Prescriptions    Medication Sig Dispense Auth. Provider   Bismuth Subsalicylate 681 MG TABS Take 2 tablets (524 mg total) by mouth every 6 (six) hours for 4 days. 32 tablet Peri Jefferson, PA-C     PDMP not reviewed this encounter.   Peri Jefferson, PA-C 06/26/20 1434

## 2020-06-27 LAB — H. PYLORI ANTIBODY, IGG: H Pylori IgG: 0.24 Index Value (ref 0.00–0.79)

## 2020-07-23 ENCOUNTER — Other Ambulatory Visit: Payer: Self-pay | Admitting: Family Medicine

## 2020-07-23 DIAGNOSIS — R519 Headache, unspecified: Secondary | ICD-10-CM

## 2020-07-23 DIAGNOSIS — I10 Essential (primary) hypertension: Secondary | ICD-10-CM

## 2020-07-23 MED ORDER — CETIRIZINE HCL 10 MG PO TABS: 10.0000 mg | ORAL_TABLET | Freq: Every day | ORAL | 0 refills | Status: DC

## 2020-07-23 MED ORDER — LOSARTAN POTASSIUM-HCTZ 100-25 MG PO TABS: 1.0000 | ORAL_TABLET | Freq: Every day | ORAL | 1 refills | Status: DC

## 2020-07-23 MED FILL — LOSARTAN-HCTZ 100-25 MG TAB: 100-25 | 30 days supply | Qty: 30 | Fill #0

## 2020-07-23 NOTE — Telephone Encounter (Signed)
Approved per protocol.  Requested Prescriptions  Pending Prescriptions Disp Refills  . cetirizine (ZYRTEC) 10 MG tablet 30 tablet 0    Sig: Take 1 tablet (10 mg total) by mouth daily.     Ear, Nose, and Throat:  Antihistamines Passed - 07/23/2020 10:58 AM      Passed - Valid encounter within last 12 months    Recent Outpatient Visits          4 months ago Essential hypertension   [facility] [place], [place], [place]   9 months ago Right wrist pain   [facility] Hoy Register, MD   10 months ago Right wrist pain   [facility] Hoy Register, MD   10 months ago Hypertension, unspecified type   [facility] [place], [place], [place]

## 2020-07-23 NOTE — Telephone Encounter (Signed)
Medication: cetirizine (ZYRTEC) 10 MG tablet [[id number]]   Has the patient contacted their pharmacy? YES  (Agent: If no, request that the patient contact the pharmacy for the refill.) (Agent: If yes, when and what did the pharmacy advise?)  Preferred Pharmacy (with phone number or street name): [facility] [place], [place] - [place] [address]  [address], [place] [address]  Phone:  [phone] Fax:  [phone]  Agent: Please be advised that RX refills may take up to 3 business days. We ask that you follow-up with your pharmacy.

## 2020-07-23 NOTE — Telephone Encounter (Signed)
Medication: losartan-hydrochlorothiazide (HYZAAR) 100-25 MG tablet [[id number]]  Has the patient contacted their pharmacy? YES (Agent: If no, request that the patient contact the pharmacy for the refill.) (Agent: If yes, when and what did the pharmacy advise?)  Preferred Pharmacy (with phone number or street name): [facility] [place], [place] - [place] [address] [place] [address] Phone: [phone] Fax: [phone] Hours: Not open 24 hours    Agent: Please be advised that RX refills may take up to 3 business days. We ask that you follow-up with your pharmacy.

## 2020-07-25 ENCOUNTER — Ambulatory Visit: Payer: Self-pay | Admitting: Surgery

## 2020-07-25 NOTE — H&P (Signed)
Fernando Cook Appointment: 07/25/2020 11:00 AM Location: [facility] Patient #: [id number] DOB: 1965/12/03 Single / Language: Fernando Cook / Race: Black or African American Male  History of Present Illness Fernando Cook; 07/25/2020 1:14 PM) The patient is a 55 year old male who presents with an umbilical hernia. Note for "Umbilical hernia": ` ` ` Patient sent for surgical consultation at the request of Dr Salvadore Oxford  Chief Complaint: Periumbilical swelling. Possible hernia. ` ` The patient is a pleasant obese male who noticed a lump around his belly button. Occasionally uncomfortable but nothing too severe. He was concerned. [facility]. Reducible umbilical hernia noted. He discussed primary care office and the offered surgical evaluation. He denies any abdominal surgery. Usually moves his bowels once or twice a day. He can walk 20-30 minutes without difficulty. Denies any cardiac or pulmonary issues. Not a blood thinners. No sleep apnea. No diabetes. No tobacco.  (Review of systems as stated in this history (HPI) or in the review of systems. Otherwise all other 12 point ROS are negative) ` ` ###########################################`  This patient encounter took 25 minutes today to perform the following: obtain history, perform exam, review outside records, interpret tests & imaging, counsel the patient on their diagnosis; and, document this encounter, including findings & plan in the electronic health record (EHR).   Diagnostic Studies History Mallie Snooks, [place]; 07/25/2020 11:36 AM) Colonoscopy within last year  Allergies Mallie Snooks, CMA; 07/25/2020 11:41 AM) No Known Drug Allergies [07/25/2020]: Allergies Reconciled  Medication History Mallie Snooks, CMA; 07/25/2020 11:42 AM) Celecoxib (200MG  Capsule, Oral) Active. Losartan Potassium-HCTZ (50-12.5MG  Tablet, Oral) Active. Vitamin D (Ergocalciferol) (1.25 MG(50000 UT) Capsule, Oral)  Active. Pantoprazole Sodium (40MG  Tablet DR, Oral) Active. Cetirizine HCl (10MG  Tablet, Oral) Active. Medications Reconciled  Social History Fernando Hector, Cook; 07/25/2020 1:14 PM) Alcohol use Recently quit alcohol use. Illicit drug use Prefer to discuss with provider. No caffeine use Tobacco use Current some day smoker. [facility]  Family History Mallie Snooks, [place]; 07/25/2020 11:36 AM) Family history unknown First Degree Relatives Hypertension Sister.  Other Problems Mallie Snooks, CMA; 07/25/2020 11:36 AM) Back Pain Gastric Ulcer High blood pressure     Review of Systems Mallie Snooks CMA; 07/25/2020 11:36 AM) General Not Present- Appetite Loss, Chills, Fatigue, Fever, Night Sweats, Weight Gain and Weight Loss. Skin Not Present- Change in Wart/Mole, Dryness, Hives, Jaundice, New Lesions, Non-Healing Wounds, Rash and Ulcer. HEENT Not Present- Earache, Hearing Loss, Hoarseness, Nose Bleed, Oral Ulcers, Ringing in the Ears, Seasonal Allergies, Sinus Pain, Sore Throat, Visual Disturbances, Wears glasses/contact lenses and Yellow Eyes. Respiratory Not Present- Bloody sputum, Chronic Cough, Difficulty Breathing, Snoring and Wheezing. Breast Not Present- Breast Mass, Breast Pain, Nipple Discharge and Skin Changes. Cardiovascular Not Present- Chest Pain, Difficulty Breathing Lying Down, Leg Cramps, Palpitations, Rapid Heart Rate, Shortness of Breath and Swelling of Extremities. Gastrointestinal Not Present- Abdominal Pain, Bloating, Bloody Stool, Change in Bowel Habits, Chronic diarrhea, Constipation, Difficulty Swallowing, Excessive gas, Gets full quickly at meals, Hemorrhoids, Indigestion, Nausea, Rectal Pain and Vomiting. Male Genitourinary Not Present- Blood in Urine, Change in Urinary Stream, Frequency, Impotence, Nocturia, Painful Urination, Urgency and Urine Leakage.  Vitals Mallie Snooks CMA; 07/25/2020 11:42 AM) 07/25/2020 11:42 AM Weight: 268.25  lb Height: 67in Body Surface Area: 2.29 m Body Mass Index: 42.01 kg/m  Temp.: 25F  Pulse: 79 (Regular)  P.OX: 97% (Room air) BP: 152/98(Sitting, Left Arm, Standard)        Physical Exam Fernando Cook;  07/25/2020 1:15 PM)  General Mental Status-Alert. General Appearance-Not in acute distress, Not Sickly. Orientation-Oriented X3. Hydration-Well hydrated. Voice-Normal.  Integumentary Global Assessment Upon inspection and palpation of skin surfaces of the - Axillae: non-tender, no inflammation or ulceration, no drainage. and Distribution of scalp and body hair is normal. General Characteristics Temperature - normal warmth is noted.  Head and Neck Head-normocephalic, atraumatic with no lesions or palpable masses. Face Global Assessment - atraumatic, no absence of expression. Neck Global Assessment - no abnormal movements, no bruit auscultated on the right, no bruit auscultated on the left, no decreased range of motion, non-tender. Trachea-midline. Thyroid Gland Characteristics - non-tender.  Eye Eyeball - Left-Extraocular movements intact, No Nystagmus - Left. Eyeball - Right-Extraocular movements intact, No Nystagmus - Right. Cornea - Left-No Hazy - Left. Cornea - Right-No Hazy - Right. Sclera/Conjunctiva - Left-No scleral icterus, No Discharge - Left. Sclera/Conjunctiva - Right-No scleral icterus, No Discharge - Right. Pupil - Left-Direct reaction to light normal. Pupil - Right-Direct reaction to light normal.  ENMT Ears Pinna - Left - no drainage observed, no generalized tenderness observed. Pinna - Right - no drainage observed, no generalized tenderness observed. Nose and Sinuses External Inspection of the Nose - no destructive lesion observed. Inspection of the nares - Left - quiet respiration. Inspection of the nares - Right - quiet respiration. Mouth and Throat Lips - Upper Lip - no fissures observed, no pallor  noted. Lower Lip - no fissures observed, no pallor noted. Nasopharynx - no discharge present. Oral Cavity/Oropharynx - Tongue - no dryness observed. Oral Mucosa - no cyanosis observed. Hypopharynx - no evidence of airway distress observed.  Chest and Lung Exam Inspection Movements - Normal and Symmetrical. Accessory muscles - No use of accessory muscles in breathing. Palpation Palpation of the chest reveals - Non-tender. Auscultation Breath sounds - Normal and Clear.  Cardiovascular Auscultation Rhythm - Regular. Murmurs & Other Heart Sounds - Auscultation of the heart reveals - No Murmurs and No Systolic Clicks.  Abdomen Inspection Inspection of the abdomen reveals - No Visible peristalsis and No Abnormal pulsations. Umbilicus - No Bleeding, No Urine drainage. Palpation/Percussion Palpation and Percussion of the abdomen reveal - Soft, Non Tender, No Rebound tenderness, No Rigidity (guarding) and No Cutaneous hyperesthesia. Note: Abdomen obese but soft. Periumbilical mass reducible down to supraumbilical/periumbilical hernia. Sensitive. Not incarcerated nor strangulated. Mild diastases recti. Nontender. Not distended. No other hernias. No guarding.  Male Genitourinary Sexual Maturity Tanner 5 - Adult hair pattern and Adult penile size and shape. Note: No inguinal hernias. Normal external genitalia. Epididymi, testes, and spermatic cords normal without any masses.  Peripheral Vascular Upper Extremity Inspection - Left - No Cyanotic nailbeds - Left, Not Ischemic. Inspection - Right - No Cyanotic nailbeds - Right, Not Ischemic.  Neurologic Neurologic evaluation reveals -normal attention span and ability to concentrate, able to name objects and repeat phrases. Appropriate fund of knowledge , normal sensation and normal coordination. Mental Status Affect - not angry, not paranoid. Cranial Nerves-Normal Bilaterally. Gait-Normal.  Neuropsychiatric Mental status exam  performed with findings of-able to articulate well with normal speech/language, rate, volume and coherence, thought content normal with ability to perform basic computations and apply abstract reasoning and no evidence of hallucinations, delusions, obsessions or homicidal/suicidal ideation.  Musculoskeletal Global Assessment Spine, Ribs and Pelvis - no instability, subluxation or laxity. Right Upper Extremity - no instability, subluxation or laxity.  Lymphatic Head & Neck  General Head & Neck Lymphatics: Bilateral - Description - No Localized lymphadenopathy. Axillary  General Axillary Region: Bilateral - Description - No Localized lymphadenopathy. Femoral & Inguinal  Generalized Femoral & Inguinal Lymphatics: Left - Description - No Localized lymphadenopathy. Right - Description - No Localized lymphadenopathy.    Assessment & Plan Fernando Cook; [id number] PM)  UMBILICAL HERNIA WITHOUT OBSTRUCTION AND WITHOUT GANGRENE (K42.9) Impression: Periumbilical mass reducible consistent with hernia.  I think at some point he would benefit from surgery to repair this given that I think it is sensitive. Is not causing severe problems right now. I did caution he will need to-3 weeks off work before returning to light duty. He works at [employer] doing light duty only.  An issue with a fall or other issues and does not do heavy lifting. He is leaning toward surgery but will think about things. We will post under the assumption he wishes to proceed with scheduling soon.   PREOP - [place] - ENCOUNTER FOR PREOPERATIVE EXAMINATION FOR GENERAL SURGICAL PROCEDURE (Z01.818)  Current Plans You are being scheduled for surgery- Our schedulers will call you.  You should hear from our office's scheduling department within 5 working days about the location, date, and time of surgery. We try to make accommodations for patient's preferences in scheduling surgery, but sometimes the OR schedule or the  surgeon's schedule prevents [place] from making those accommodations.  If you have not heard from our office [phone]) in 5 working days, call the office and ask for your surgeon's nurse.  If you have other questions about your diagnosis, plan, or surgery, call the office and ask for your surgeon's nurse.  Written instructions provided [facility] Consent - Hernia Repair - Ventral/Incisional/Umbilical (Reginald Mangels): discussed with patient and provided information. Pt Education - [facility] Hernia Post-Op HCI (Manson Luckadoo): discussed with patient and provided information. Pt Education - [facility] Pain Control (Evalin Shawhan) Pt Education - Pamphlet Given - Laparoscopic Hernia Repair: discussed with patient and provided information. Pt Education - [facility] Mesh education: discussed with patient and provided information.  MORBID OBESITY WITH BMI OF 40.0-44.9, ADULT (E66.01)   HYPERTENSION, ESSENTIAL (I10)  Fernando Hector, Cook, FACS, MASCRS Gastrointestinal and Minimally Invasive Surgery  [facility] [address], [place], [place] [address] [phone] Fax [phone] Main/Paging  CONTACT INFORMATION: Weekday (9AM-5PM) concerns: Call [facility] main office at [phone] Weeknight (5PM-9AM) or Weekend/Holiday concerns: Check [email or web address] for General Surgery [facility] coverage (Please, do not use SecureChat as it is not reliable communication to operating surgeons for immediate patient care)

## 2020-08-04 ENCOUNTER — Other Ambulatory Visit: Payer: Self-pay

## 2020-08-04 ENCOUNTER — Encounter (HOSPITAL_COMMUNITY): Payer: Self-pay | Admitting: Emergency Medicine

## 2020-08-04 ENCOUNTER — Emergency Department (HOSPITAL_COMMUNITY)
Admission: EM | Admit: 2020-08-04 | Discharge: 2020-08-04 | Disposition: A | Discharge status: Home / Self Care | Payer: 59 | Attending: Emergency Medicine | Admitting: Emergency Medicine

## 2020-08-04 DIAGNOSIS — Z79899 Other long term (current) drug therapy: Secondary | ICD-10-CM | POA: Insufficient documentation

## 2020-08-04 DIAGNOSIS — Z20822 Contact with and (suspected) exposure to covid-19: Secondary | ICD-10-CM | POA: Insufficient documentation

## 2020-08-04 DIAGNOSIS — I10 Essential (primary) hypertension: Secondary | ICD-10-CM | POA: Diagnosis not present

## 2020-08-04 NOTE — Discharge Instructions (Signed)
-  Your COVID test is in process. It should result in 6 to 24 hours. If it is positive you will receive a phone call. If positive follow the suggestions below. -If COVID-positive you will need to quarantine per CDC guidelines.  IF POSITIVE: if negative disregard   Medications- You can take medications to help treat your symptoms: -Tylenol for fever and body aches. Please take as prescribed on the bottle. -Over the coutner cough medicine such as mucinex, robitussin, or other brands. -Flonase or saline nasal spray for nasal congestion -Vitamins as recommended by CDC  Treatment- This is a virus and unfortunately there are no antibitotics approved to treat this virus at this time. It is important to monitor your symptoms closely: -You should have a theremometer at home to check your temperature when feeling feverish. -Use a pulse ox meter to measure your oxygen when feeling short of breath.  -If your fever is over 100.4 despite taking tylenol or if your oxygen level drops below 94% these are reasons to return to the emergency department for further evaluation.   -You need to quarantine for 10 days starting today.  You can return to work, school or normal activities if on day 10 you are fever free without the use of Tylenol or ibuprofen.  You will need to continue quarantine if you still have a fever over 100.4.  Again: symptoms of shortness of breath, chest pain, difficulty breathing, new onset of confusion, any symptoms that are concerning. If any of these symptoms you should come to emergency department for evaluation.   I hope you feel better soon

## 2020-08-04 NOTE — ED Triage Notes (Signed)
Patient reports headache x2 days. States roommate Covid+ but tested negative. Requesting additional test. Denies cough and fever.

## 2020-08-04 NOTE — ED Provider Notes (Signed)
[facility] DEPT Provider Note   CSN: [id number] Arrival date & time: 08/04/20  1048     History Chief Complaint  Patient presents with  . Covid Exposure    Fernando Cook is a 55 y.o. male with past medical history significant for hypertension, hiatal hernia, substance abuse. Had COVID vaccinations.  HPI Patient presents emergency department today with chief complaint of COVID exposure. He states he lives in a halfway house and his roommate tested positive for COVID recently. He took a home test and was negative however staff at the halfway house are asking him to be COVID tested. Patient states he has had intermittent headaches for the last x2 days. This is not unusual for him. He does not have a headache currently. He denies any head injury or trauma. When he has his headaches he denies any associated neck pain or stiffness. Denies any URI symptoms. Denies , syncope, photophobia, phonophobia, UL throbbing, N/V, visual changes, rash, or "thunderclap" onset.      Past Medical History:  Diagnosis Date  . Hiatal hernia   . Hypertension   . Substance abuse ([place])    ETOH/ stopped drinking nov. 2020    Patient Active Problem List   Diagnosis Date Noted  . Hypertension 09/08/2019    Past Surgical History:  Procedure Laterality Date  . NO PAST SURGERIES         Family History  Problem Relation Age of Onset  . Hypertension Mother   . Hyperlipidemia Mother   . Prostate cancer Father   . Esophageal cancer Neg Hx   . Colon cancer Neg Hx   . Stomach cancer Neg Hx   . Pancreatic cancer Neg Hx     Social History   Tobacco Use  . Smoking status: Never Smoker  . Smokeless tobacco: Never Used  Vaping Use  . Vaping Use: Never used  Substance Use Topics  . Alcohol use: Not Currently  . Drug use: Not Currently    Home Medications Prior to Admission medications   Medication Sig Start Date End Date Taking? Authorizing Provider  celecoxib  (CELEBREX) 200 MG capsule TAKE 1 CAPSULE (200 MG TOTAL) BY MOUTH DAILY AS NEEDED FOR PAIN 06/19/20   Charlott Rakes, MD  cetirizine (ZYRTEC) 10 MG tablet Take 1 tablet (10 mg total) by mouth daily. 07/23/20   Charlott Rakes, MD  losartan-hydrochlorothiazide (HYZAAR) 100-25 MG tablet Take 1 tablet by mouth daily. 07/23/20   Charlott Rakes, MD  pantoprazole (PROTONIX) 40 MG tablet Take 1 tablet (40 mg total) by mouth 2 (two) times daily before a meal. 02/29/20   McClung, Dionne Bucy, PA-C  Vitamin D, Ergocalciferol, (DRISDOL) 1.25 MG (50000 UNIT) CAPS capsule Take 1 capsule (50,000 Units total) by mouth every 7 (seven) days. 03/01/20   Argentina Donovan, PA-C  omeprazole (PRILOSEC) 20 MG capsule Take 1 capsule (20 mg total) by mouth daily. Patient not taking: Reported on 09/26/2019 07/15/19 01/10/20  Wyvonnia Dusky, MD  sucralfate (CARAFATE) 1 g tablet Take 1 tablet (1 g total) by mouth 4 (four) times daily -  with meals and at bedtime. Patient not taking: Reported on 09/26/2019 08/15/19 01/10/20  Mauri Pole, MD    Allergies    Patient has no known allergies.  Review of Systems   Review of Systems All other systems are reviewed and are negative for acute change except as noted in the HPI.  Physical Exam Updated Vital Signs BP (!) 142/98 (BP Location: Right Arm)  Pulse 72   Temp 98.4 F (36.9 C) (Oral)   Resp 16   SpO2 90%   Physical Exam Vitals and nursing note reviewed.  Constitutional:      Appearance: He is well-developed. He is not ill-appearing or toxic-appearing.  HENT:     Head: Normocephalic and atraumatic.     Nose: Nose normal.  Eyes:     General: No scleral icterus.       Right eye: No discharge.        Left eye: No discharge.     Conjunctiva/sclera: Conjunctivae normal.  Neck:     Vascular: No JVD.     Comments: No meningeal signs Cardiovascular:     Rate and Rhythm: Normal rate and regular rhythm.     Pulses: Normal pulses.     Heart sounds: Normal heart  sounds.  Pulmonary:     Effort: Pulmonary effort is normal.     Breath sounds: Normal breath sounds.  Abdominal:     General: There is no distension.  Musculoskeletal:        General: Normal range of motion.     Cervical back: Normal range of motion.  Skin:    General: Skin is warm and dry.  Neurological:     Mental Status: He is oriented to person, place, and time.     GCS: GCS eye subscore is 4. GCS verbal subscore is 5. GCS motor subscore is 6.     Comments: Speech is clear and goal oriented, follows commands CN III-XII intact, no facial droop Normal strength in upper and lower extremities bilaterally including dorsiflexion and plantar flexion, strong and equal grip strength Sensation normal to light and sharp touch Moves extremities without ataxia, coordination intact Normal finger to nose and rapid alternating movements Normal gait and balance  Psychiatric:        Behavior: Behavior normal.     ED Results / Procedures / Treatments   Labs (all labs ordered are listed, but only abnormal results are displayed) Labs Reviewed  SARS CORONAVIRUS 2 (TAT 6-24 HRS)    EKG None  Radiology No results found.  Procedures Procedures (including critical care time)  Medications Ordered in ED Medications - No data to display  ED Course  I have reviewed the triage vital signs and the nursing notes.  Pertinent labs & imaging results that were available during my care of the patient were reviewed by me and considered in my medical decision making (see chart for details).    MDM Rules/Calculators/A&P                          History provided by patient with additional history obtained from chart review.    55 year old male presenting for COVID testing. VSS. Had headaches intermittently for 2 days although is asymptomatic at time of exam. Neuro exam is normal, no focal weakness, no meningeal signs. COVID test ordered and is in process. He is aware he will be contacted if results  are positive. Discussed symptomatic treatment if tests are positive well as quarantine per CDC guidelines. Strict return precautions discussed. Patient discharged in stable condition.   Fernando Cook was evaluated in Emergency Department on 08/04/2020 for the symptoms described in the history of present illness. He was evaluated in the context of the global COVID-19 pandemic, which necessitated consideration that the patient might be at risk for infection with the SARS-CoV-2 virus that causes COVID-19. Institutional protocols and algorithms that pertain  to the evaluation of patients at risk for COVID-19 are in a state of rapid change based on information released by regulatory bodies including the CDC and federal and state organizations. These policies and algorithms were followed during the patient's care in the ED.   Portions of this note were generated with [employer]. Dictation errors may occur despite best attempts at proofreading.  Final Clinical Impression(s) / ED Diagnoses Final diagnoses:  Close exposure to COVID-19 virus    Rx / DC Orders ED Discharge Orders    None       Lewanda Rife 08/04/20 1325    Lucrezia Starch, MD 08/05/20 [phone]

## 2020-08-05 LAB — SARS CORONAVIRUS 2 (TAT 6-24 HRS): SARS Coronavirus 2: NEGATIVE

## 2020-08-22 MED FILL — PANTOPRAZOLE SOD DR 40 MG T: 40 | 30 days supply | Qty: 60 | Fill #2

## 2020-08-22 MED FILL — LOSARTAN-HCTZ 100-25 MG TAB: 100-25 | 30 days supply | Qty: 30 | Fill #1

## 2020-08-22 MED FILL — CELECOXIB 200 MG CAP: 200 | 30 days supply | Qty: 30 | Fill #1

## 2020-09-27 ENCOUNTER — Other Ambulatory Visit: Payer: Self-pay | Admitting: Family Medicine

## 2020-09-27 MED ORDER — CELECOXIB 200 MG PO CAPS: ORAL_CAPSULE | ORAL | 0 refills | Status: DC

## 2020-09-27 NOTE — Telephone Encounter (Signed)
Requested medication (s) are due for refill today: no  Requested medication (s) are on the active medication list: yes  Future visit scheduled: yes  Notes to clinic:  Review for continue used  Last filled 06/19/2020   Requested Prescriptions  Pending Prescriptions Disp Refills   celecoxib (CELEBREX) 200 MG capsule 30 capsule 0    Sig: TAKE 1 CAPSULE (200 MG TOTAL) BY MOUTH DAILY AS NEEDED FOR PAIN      Analgesics:  COX2 Inhibitors Failed - 09/27/2020  9:21 AM      Failed - HGB in normal range and within 360 days    Hemoglobin  Date Value Ref Range Status  01/22/2020 12.9 (L) 13.0 - 17.0 g/dL Final          Passed - Cr in normal range and within 360 days    Creatinine, Ser  Date Value Ref Range Status  02/29/2020 0.80 0.76 - 1.27 mg/dL Final          Passed - Patient is not pregnant      Passed - Valid encounter within last 12 months    Recent Outpatient Visits           7 months ago Essential hypertension   [place], [place], [place]   11 months ago Right wrist pain   [place], Charlane Ferretti, [place]   1 year ago Right wrist pain   [place], [place], MD   1 year ago Hypertension, unspecified type   [place], Dionne Bucy, [place]       Future Appointments             In 6 days Gildardo Pounds, NP [place]

## 2020-09-27 NOTE — Telephone Encounter (Signed)
Medication Refill - Medication: celecoxib (CELEBREX) 200 MG capsule     Preferred Pharmacy (with phone number or street name):  [place], [place] [address] Phone:  [phone]  Fax:  [phone]       Agent: Please be advised that RX refills may take up to 3 business days. We ask that you follow-up with your pharmacy.

## 2020-09-28 MED FILL — CELECOXIB 200 MG CAP: 200 | 30 days supply | Qty: 30 | Fill #0

## 2020-10-03 ENCOUNTER — Other Ambulatory Visit: Payer: Self-pay

## 2020-10-03 ENCOUNTER — Telehealth: Payer: Self-pay | Admitting: Family Medicine

## 2020-10-03 ENCOUNTER — Encounter: Payer: Self-pay | Admitting: Nurse Practitioner

## 2020-10-03 ENCOUNTER — Ambulatory Visit: Payer: 59 | Attending: Nurse Practitioner | Admitting: Nurse Practitioner

## 2020-10-03 ENCOUNTER — Other Ambulatory Visit: Payer: Self-pay | Admitting: Nurse Practitioner

## 2020-10-03 VITALS — BP 138/95 | HR 67 | Ht 67.0 in | Wt 260.8 lb

## 2020-10-03 DIAGNOSIS — D649 Anemia, unspecified: Secondary | ICD-10-CM

## 2020-10-03 DIAGNOSIS — E785 Hyperlipidemia, unspecified: Secondary | ICD-10-CM

## 2020-10-03 DIAGNOSIS — Z125 Encounter for screening for malignant neoplasm of prostate: Secondary | ICD-10-CM

## 2020-10-03 DIAGNOSIS — E559 Vitamin D deficiency, unspecified: Secondary | ICD-10-CM

## 2020-10-03 DIAGNOSIS — K5909 Other constipation: Secondary | ICD-10-CM

## 2020-10-03 DIAGNOSIS — I10 Essential (primary) hypertension: Secondary | ICD-10-CM | POA: Diagnosis not present

## 2020-10-03 DIAGNOSIS — R7303 Prediabetes: Secondary | ICD-10-CM

## 2020-10-03 MED ORDER — LOSARTAN POTASSIUM-HCTZ 100-25 MG PO TABS: 1.0000 | ORAL_TABLET | Freq: Every day | ORAL | 1 refills | Status: DC

## 2020-10-03 MED ORDER — SENNOSIDES-DOCUSATE SODIUM 8.6-50 MG PO TABS: 2.0000 | ORAL_TABLET | Freq: Every day | ORAL | 2 refills | Status: DC

## 2020-10-03 MED ORDER — AMLODIPINE BESYLATE 5 MG PO TABS: 5.0000 mg | ORAL_TABLET | Freq: Every day | ORAL | 3 refills | Status: DC

## 2020-10-03 MED ORDER — AMLODIPINE BESYLATE 5 MG PO TABS
5.0000 mg | ORAL_TABLET | Freq: Every day | ORAL | 3 refills | Status: DC
Start: 2020-10-03 — End: 2020-10-03

## 2020-10-03 NOTE — Telephone Encounter (Signed)
Called patient and LVM advising patient I was calling to schedule a follow up appointment and to call [phone] to schedule.   Patient needs an in person appointment with Dr. Margarita Rana in 3 months for HTN (around 01/03/21).

## 2020-10-03 NOTE — Progress Notes (Signed)
Assessment & Plan:  Fernando Cook was seen today for medication refill.  Diagnoses and all orders for this visit:  Prediabetes -     Hemoglobin A1c  Essential hypertension -     CMP14+EGFR -     losartan-hydrochlorothiazide (HYZAAR) 100-25 MG tablet; Take 1 tablet by mouth daily. -     amLODipine (NORVASC) 5 MG tablet; Take 1 tablet (5 mg total) by mouth daily. Continue all antihypertensives as prescribed.  Remember to bring in your blood pressure log with you for your follow up appointment.  DASH/Mediterranean Diets are healthier choices for HTN.    Anemia, unspecified type -     CBC  Chronic constipation -     senna-docusate (SENOKOT-S) 8.6-50 MG tablet; Take 2 tablets by mouth daily.  Dyslipidemia, goal LDL below 100 -     Lipid panel INSTRUCTIONS: Work on a low fat, heart healthy diet and participate in regular aerobic exercise program by working out at least 150 minutes per week; 5 days a week-30 minutes per day. Avoid red meat/beef/steak,  fried foods. junk foods, sodas, sugary drinks, unhealthy snacking, alcohol and smoking.  Drink at least 80 oz of water per day and monitor your carbohydrate intake daily.   Prostate cancer screening -     PSA  Vitamin D deficiency -     VITAMIN D 25 Hydroxy (Vit-D Deficiency, Fractures)     Patient has been counseled on age-appropriate routine health concerns for screening and prevention. These are reviewed and up-to-date. Referrals have been placed accordingly. Immunizations are up-to-date or declined.    Subjective:   Chief Complaint  Patient presents with  . Medication Refill   HPI Fernando Cook 55 y.o. male presents to office today for medication refill. He has not seen his PCP in almost 1 year.  He has a past medical history of Hiatal hernia, Hypertension, and Substance abuse.  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 He is seeing an orthopedist and has worked with physical therapy for [employer].  He is  requesting a stronger pain medication today and states he is currently taking tramadol and a muscle relaxant prescribed by the orthopedist.  States the medications make him too sleepy at work.  I instructed him that we do not prescribe any other opioids for pain aside from tylenol #3 here at this clinic.    Essential Hypertension Blood pressure is elevated today.  I am adding amlodipine 5 mg and he will continue taking losartan-hydrochlorothiazide 100-25 mg daily. Denies chest pain, shortness of breath, palpitations, lightheadedness, dizziness, headaches or BLE edema.  He is a smoker and does skip taking his blood pressure medications on some days.  BP Readings from Last 3 Encounters:  10/03/20 (!) 138/95  08/04/20 (!) 142/98  06/26/20 (!) 158/91     Review of Systems  Constitutional: Negative for fever, malaise/fatigue and weight loss.  HENT: Negative.  Negative for nosebleeds.   Eyes: Negative.  Negative for blurred vision, double vision and photophobia.  Respiratory: Negative.  Negative for cough and shortness of breath.   Cardiovascular: Negative.  Negative for chest pain, palpitations and leg swelling.  Gastrointestinal: Positive for constipation (chronic) and heartburn. Negative for nausea and vomiting.  Musculoskeletal: Negative.  Negative for myalgias.  Neurological: Negative.  Negative for dizziness, focal weakness, seizures and headaches.  Endo/Heme/Allergies: Positive for environmental allergies.  Psychiatric/Behavioral: Negative.  Negative for suicidal ideas.    Past Medical History:  Diagnosis Date  . Hiatal hernia   .  Hypertension   . Substance abuse ([place])    ETOH/ stopped drinking nov. 2020    Past Surgical History:  Procedure Laterality Date  . NO PAST SURGERIES      Family History  Problem Relation Age of Onset  . Hypertension Mother   . Hyperlipidemia Mother   . Prostate cancer Father   . Esophageal cancer Neg Hx   . Colon cancer Neg Hx   . Stomach  cancer Neg Hx   . Pancreatic cancer Neg Hx     Social History Reviewed with no changes to be made today.   Outpatient Medications Prior to Visit  Medication Sig Dispense Refill  . celecoxib (CELEBREX) 200 MG capsule TAKE 1 CAPSULE (200 MG TOTAL) BY MOUTH DAILY AS NEEDED FOR PAIN 30 capsule 0  . cetirizine (ZYRTEC) 10 MG tablet Take 1 tablet (10 mg total) by mouth daily. 30 tablet 0  . pantoprazole (PROTONIX) 40 MG tablet Take 1 tablet (40 mg total) by mouth 2 (two) times daily before a meal. 90 tablet 3  . Vitamin D, Ergocalciferol, (DRISDOL) 1.25 MG (50000 UNIT) CAPS capsule Take 1 capsule (50,000 Units total) by mouth every 7 (seven) days. 16 capsule 0  . losartan-hydrochlorothiazide (HYZAAR) 100-25 MG tablet Take 1 tablet by mouth daily. 30 tablet 1   No facility-administered medications prior to visit.    No Known Allergies     Objective:    BP (!) 138/95   Pulse 67   Ht '5\' 7"'  (1.702 m)   Wt 260 lb 12.8 oz (118.3 kg)   SpO2 94%   BMI 40.85 kg/m  Wt Readings from Last 3 Encounters:  10/03/20 260 lb 12.8 oz (118.3 kg)  02/29/20 259 lb (117.5 kg)  01/22/20 252 lb (114.3 kg)    Physical Exam Vitals and nursing note reviewed.  Constitutional:      Appearance: He is well-developed.  HENT:     Head: Normocephalic and atraumatic.  Cardiovascular:     Rate and Rhythm: Normal rate and regular rhythm.     Heart sounds: Normal heart sounds. No murmur heard. No friction rub. No gallop.   Pulmonary:     Effort: Pulmonary effort is normal. No tachypnea or respiratory distress.     Breath sounds: Normal breath sounds. No decreased breath sounds, wheezing, rhonchi or rales.  Chest:     Chest wall: No tenderness.  Abdominal:     General: Bowel sounds are normal.     Palpations: Abdomen is soft.  Musculoskeletal:        General: Normal range of motion.     Cervical back: Normal range of motion.  Skin:    General: Skin is warm and dry.  Neurological:     Mental Status: He  is alert and oriented to person, place, and time.     Coordination: Coordination normal.  Psychiatric:        Behavior: Behavior normal. Behavior is cooperative.        Thought Content: Thought content normal.        Judgment: Judgment normal.          Patient has been counseled extensively about nutrition and exercise as well as the importance of adherence with medications and regular follow-up. The patient was given clear instructions to go to ER or return to medical center if symptoms don't improve, worsen or new problems develop. The patient verbalized understanding.   Follow-up: Return in about 3 months (around 01/03/2021) for [place] F/U HTN.  Gildardo Pounds, FNP-BC [facility], [place]   10/03/2020, 12:22 PM

## 2020-10-04 LAB — CBC
Hematocrit: 41.5 % (ref 37.5–51.0)
Hemoglobin: 13.1 g/dL (ref 13.0–17.7)
MCH: 26 pg — ABNORMAL LOW (ref 26.6–33.0)
MCHC: 31.6 g/dL (ref 31.5–35.7)
MCV: 82 fL (ref 79–97)
Platelets: 272 10*3/uL (ref 150–450)
RBC: 5.04 x10E6/uL (ref 4.14–5.80)
RDW: 13.3 % (ref 11.6–15.4)
WBC: 6.5 10*3/uL (ref 3.4–10.8)

## 2020-10-04 LAB — CMP14+EGFR
ALT: 21 IU/L (ref 0–44)
AST: 28 IU/L (ref 0–40)
Albumin/Globulin Ratio: 1.4 (ref 1.2–2.2)
Albumin: 4.2 g/dL (ref 3.8–4.9)
Alkaline Phosphatase: 81 IU/L (ref 44–121)
BUN/Creatinine Ratio: 15 (ref 9–20)
BUN: 14 mg/dL (ref 6–24)
Bilirubin Total: 0.5 mg/dL (ref 0.0–1.2)
CO2: 25 mmol/L (ref 20–29)
Calcium: 9.1 mg/dL (ref 8.7–10.2)
Chloride: 99 mmol/L (ref 96–106)
Creatinine, Ser: 0.96 mg/dL (ref 0.76–1.27)
Globulin, Total: 3.1 g/dL (ref 1.5–4.5)
Glucose: 98 mg/dL (ref 65–99)
Potassium: 3.8 mmol/L (ref 3.5–5.2)
Sodium: 140 mmol/L (ref 134–144)
Total Protein: 7.3 g/dL (ref 6.0–8.5)
eGFR: 93 mL/min/{1.73_m2} (ref >59)

## 2020-10-04 LAB — HEMOGLOBIN A1C
Est. average glucose Bld gHb Est-mCnc: 126 mg/dL
Hgb A1c MFr Bld: 6 % — ABNORMAL HIGH (ref 4.8–5.6)

## 2020-10-04 LAB — LIPID PANEL
Chol/HDL Ratio: 3.9 ratio (ref 0.0–5.0)
Cholesterol, Total: 170 mg/dL (ref 100–199)
HDL: 44 mg/dL (ref >39)
LDL Chol Calc (NIH): 104 mg/dL — ABNORMAL HIGH (ref 0–99)
Triglycerides: 125 mg/dL (ref 0–149)
VLDL Cholesterol Cal: 22 mg/dL (ref 5–40)

## 2020-10-04 LAB — VITAMIN D 25 HYDROXY (VIT D DEFICIENCY, FRACTURES): Vit D, 25-Hydroxy: 63.6 ng/mL (ref 30.0–100.0)

## 2020-10-04 LAB — PSA: Prostate Specific Ag, Serum: 0.4 ng/mL (ref 0.0–4.0)

## 2020-10-11 ENCOUNTER — Ambulatory Visit: Payer: Self-pay | Admitting: *Deleted

## 2020-10-11 ENCOUNTER — Telehealth: Payer: Self-pay | Admitting: Family Medicine

## 2020-10-11 NOTE — Telephone Encounter (Signed)
Pt given lab results per notes of Z. Raul Del, NP from 10/06/20 on 10/11/20. Pt verbalized understanding and would like to call back to schedule a 3 month f/u with Dr. Margarita Rana .

## 2020-10-11 NOTE — Telephone Encounter (Signed)
Copied from [place] [id number]. Topic: General - Inquiry >> Oct 09, 2020 12:19 PM Greggory Keen D wrote: Reason for CRM: pt is returning a call regarding his lab results.  Pt last appt was with Raul Del.

## 2020-10-11 NOTE — Telephone Encounter (Signed)
Please schedule patient 3 months with Dr.Newlin.

## 2020-10-11 NOTE — Telephone Encounter (Signed)
Pt called and VM was left informing patient return call for results  CRM created to give results when patient return call.

## 2020-10-20 ENCOUNTER — Other Ambulatory Visit: Payer: Self-pay

## 2020-10-22 NOTE — Telephone Encounter (Signed)
Called Pt no answer.Left vm for Pt to call [phone] to get scheduled for a 3 month f/u with Dr. Margarita Rana

## 2020-11-20 ENCOUNTER — Other Ambulatory Visit: Payer: Self-pay | Admitting: Physician Assistant

## 2020-11-20 NOTE — Telephone Encounter (Signed)
Requested Prescriptions  Pending Prescriptions Disp Refills  . celecoxib (CELEBREX) 200 MG capsule [Pharmacy Med Name: Celecoxib 200 MG Oral Capsule] 30 capsule 0    Sig: TAKE 1 CAPSULE BY MOUTH ONCE DAILY AS NEEDED FOR PAIN     Analgesics:  COX2 Inhibitors Passed - 11/20/2020  6:54 PM      Passed - HGB in normal range and within 360 days    Hemoglobin  Date Value Ref Range Status  10/03/2020 13.1 13.0 - 17.7 g/dL Final         Passed - Cr in normal range and within 360 days    Creatinine, Ser  Date Value Ref Range Status  10/03/2020 0.96 0.76 - 1.27 mg/dL Final         Passed - Patient is not pregnant      Passed - Valid encounter within last 12 months    Recent Outpatient Visits          1 month ago Prediabetes   [place], Vernia Buff, NP   8 months ago Essential hypertension   [place], [place], [place]   1 year ago Right wrist pain   [place], Enobong, MD   1 year ago Right wrist pain   [place], [place]ong, MD   1 year ago Hypertension, unspecified type   [place], [place]      Future Appointments            In 1 month Charlott Rakes, MD [place]

## 2020-12-24 ENCOUNTER — Telehealth: Payer: Self-pay | Admitting: Family Medicine

## 2020-12-24 NOTE — Telephone Encounter (Signed)
Patient appt on 6/7 is virtual with Dr. Margarita Rana. Called patient no answer. Left a voicemail that the appt is virtual and Dr. Margarita Rana would call around 1:30. If in person is preferred to call [phone] to reschedule.

## 2020-12-25 ENCOUNTER — Telehealth: Payer: 59 | Admitting: Family Medicine

## 2021-01-13 ENCOUNTER — Encounter (HOSPITAL_COMMUNITY): Payer: Self-pay

## 2021-01-13 ENCOUNTER — Other Ambulatory Visit: Payer: Self-pay

## 2021-01-13 ENCOUNTER — Inpatient Hospital Stay (HOSPITAL_COMMUNITY)
Admission: EM | Admit: 2021-01-13 | Discharge: 2021-01-15 | DRG: 354 | Disposition: A | Discharge status: Home / Self Care | Payer: 59 | Attending: Surgery | Admitting: Surgery

## 2021-01-13 DIAGNOSIS — Z791 Long term (current) use of non-steroidal anti-inflammatories (NSAID): Secondary | ICD-10-CM

## 2021-01-13 DIAGNOSIS — E669 Obesity, unspecified: Secondary | ICD-10-CM | POA: Diagnosis present

## 2021-01-13 DIAGNOSIS — Z9119 Patient's noncompliance with other medical treatment and regimen: Secondary | ICD-10-CM

## 2021-01-13 DIAGNOSIS — R7303 Prediabetes: Secondary | ICD-10-CM

## 2021-01-13 DIAGNOSIS — F1011 Alcohol abuse, in remission: Secondary | ICD-10-CM | POA: Diagnosis present

## 2021-01-13 DIAGNOSIS — I10 Essential (primary) hypertension: Secondary | ICD-10-CM | POA: Diagnosis present

## 2021-01-13 DIAGNOSIS — Z91199 Patient's noncompliance with other medical treatment and regimen due to unspecified reason: Secondary | ICD-10-CM

## 2021-01-13 DIAGNOSIS — Z20822 Contact with and (suspected) exposure to covid-19: Secondary | ICD-10-CM | POA: Diagnosis present

## 2021-01-13 DIAGNOSIS — K460 Unspecified abdominal hernia with obstruction, without gangrene: Secondary | ICD-10-CM | POA: Diagnosis not present

## 2021-01-13 DIAGNOSIS — K449 Diaphragmatic hernia without obstruction or gangrene: Secondary | ICD-10-CM

## 2021-01-13 DIAGNOSIS — Z79899 Other long term (current) drug therapy: Secondary | ICD-10-CM

## 2021-01-13 DIAGNOSIS — Z599 Problem related to housing and economic circumstances, unspecified: Secondary | ICD-10-CM

## 2021-01-13 DIAGNOSIS — Z8249 Family history of ischemic heart disease and other diseases of the circulatory system: Secondary | ICD-10-CM

## 2021-01-13 DIAGNOSIS — L03818 Cellulitis of other sites: Secondary | ICD-10-CM | POA: Diagnosis present

## 2021-01-13 DIAGNOSIS — Z8711 Personal history of peptic ulcer disease: Secondary | ICD-10-CM

## 2021-01-13 DIAGNOSIS — K259 Gastric ulcer, unspecified as acute or chronic, without hemorrhage or perforation: Secondary | ICD-10-CM | POA: Diagnosis present

## 2021-01-13 DIAGNOSIS — K219 Gastro-esophageal reflux disease without esophagitis: Secondary | ICD-10-CM | POA: Diagnosis present

## 2021-01-13 DIAGNOSIS — K209 Esophagitis, unspecified without bleeding: Secondary | ICD-10-CM | POA: Diagnosis present

## 2021-01-13 DIAGNOSIS — K436 Other and unspecified ventral hernia with obstruction, without gangrene: Principal | ICD-10-CM | POA: Diagnosis present

## 2021-01-13 NOTE — ED Triage Notes (Signed)
Pt states that he was moving his speaker in his car 2 days ago and noticed pain in his abdomen after. He states that he has had the hernia for about 8 months but it did not bother him. It is now bulging and hurting.

## 2021-01-14 ENCOUNTER — Encounter (HOSPITAL_COMMUNITY): Payer: Self-pay | Admitting: Surgery

## 2021-01-14 ENCOUNTER — Emergency Department (HOSPITAL_COMMUNITY): Payer: 59

## 2021-01-14 ENCOUNTER — Emergency Department (HOSPITAL_COMMUNITY): Payer: 59 | Admitting: Certified Registered Nurse Anesthetist

## 2021-01-14 ENCOUNTER — Encounter (HOSPITAL_COMMUNITY): Admission: EM | Disposition: A | Discharge status: Home / Self Care | Payer: Self-pay | Source: Home / Self Care

## 2021-01-14 DIAGNOSIS — Z599 Problem related to housing and economic circumstances, unspecified: Secondary | ICD-10-CM | POA: Diagnosis not present

## 2021-01-14 DIAGNOSIS — Z8711 Personal history of peptic ulcer disease: Secondary | ICD-10-CM | POA: Diagnosis not present

## 2021-01-14 DIAGNOSIS — Z791 Long term (current) use of non-steroidal anti-inflammatories (NSAID): Secondary | ICD-10-CM | POA: Diagnosis not present

## 2021-01-14 DIAGNOSIS — Z8042 Family history of malignant neoplasm of prostate: Secondary | ICD-10-CM

## 2021-01-14 DIAGNOSIS — Z20822 Contact with and (suspected) exposure to covid-19: Secondary | ICD-10-CM | POA: Diagnosis present

## 2021-01-14 DIAGNOSIS — I10 Essential (primary) hypertension: Secondary | ICD-10-CM | POA: Diagnosis present

## 2021-01-14 DIAGNOSIS — L03818 Cellulitis of other sites: Secondary | ICD-10-CM | POA: Diagnosis present

## 2021-01-14 DIAGNOSIS — R7303 Prediabetes: Secondary | ICD-10-CM | POA: Diagnosis present

## 2021-01-14 DIAGNOSIS — F1011 Alcohol abuse, in remission: Secondary | ICD-10-CM | POA: Diagnosis present

## 2021-01-14 DIAGNOSIS — K449 Diaphragmatic hernia without obstruction or gangrene: Secondary | ICD-10-CM

## 2021-01-14 DIAGNOSIS — Z79899 Other long term (current) drug therapy: Secondary | ICD-10-CM | POA: Diagnosis not present

## 2021-01-14 DIAGNOSIS — K436 Other and unspecified ventral hernia with obstruction, without gangrene: Secondary | ICD-10-CM

## 2021-01-14 DIAGNOSIS — K460 Unspecified abdominal hernia with obstruction, without gangrene: Secondary | ICD-10-CM | POA: Diagnosis present

## 2021-01-14 DIAGNOSIS — Z8249 Family history of ischemic heart disease and other diseases of the circulatory system: Secondary | ICD-10-CM | POA: Diagnosis not present

## 2021-01-14 DIAGNOSIS — K259 Gastric ulcer, unspecified as acute or chronic, without hemorrhage or perforation: Secondary | ICD-10-CM | POA: Diagnosis present

## 2021-01-14 DIAGNOSIS — Z91199 Patient's noncompliance with other medical treatment and regimen due to unspecified reason: Secondary | ICD-10-CM

## 2021-01-14 DIAGNOSIS — Z9119 Patient's noncompliance with other medical treatment and regimen: Secondary | ICD-10-CM | POA: Diagnosis not present

## 2021-01-14 DIAGNOSIS — E669 Obesity, unspecified: Secondary | ICD-10-CM

## 2021-01-14 DIAGNOSIS — K219 Gastro-esophageal reflux disease without esophagitis: Secondary | ICD-10-CM | POA: Diagnosis present

## 2021-01-14 DIAGNOSIS — K209 Esophagitis, unspecified without bleeding: Secondary | ICD-10-CM | POA: Diagnosis present

## 2021-01-14 HISTORY — DX: Family history of malignant neoplasm of prostate: Z80.42

## 2021-01-14 HISTORY — DX: Obesity, unspecified: E66.9

## 2021-01-14 HISTORY — DX: Other and unspecified ventral hernia with obstruction, without gangrene: K43.6

## 2021-01-14 HISTORY — DX: Esophagitis, unspecified without bleeding: K20.90

## 2021-01-14 HISTORY — DX: Patient's noncompliance with other medical treatment and regimen due to unspecified reason: Z91.199

## 2021-01-14 HISTORY — DX: Alcohol abuse, in remission: F10.11

## 2021-01-14 HISTORY — PX: UMBILICAL HERNIA REPAIR: SHX196

## 2021-01-14 LAB — URINALYSIS, ROUTINE W REFLEX MICROSCOPIC
Bilirubin Urine: NEGATIVE
Glucose, UA: NEGATIVE mg/dL
Hgb urine dipstick: NEGATIVE
Ketones, ur: NEGATIVE mg/dL
Leukocytes,Ua: NEGATIVE
Nitrite: NEGATIVE
Protein, ur: NEGATIVE mg/dL
Specific Gravity, Urine: 1.021 (ref 1.005–1.030)
pH: 5 (ref 5.0–8.0)

## 2021-01-14 LAB — COMPREHENSIVE METABOLIC PANEL
ALT: 20 U/L (ref 0–44)
AST: 20 U/L (ref 15–41)
Albumin: 3.7 g/dL (ref 3.5–5.0)
Alkaline Phosphatase: 58 U/L (ref 38–126)
Anion gap: 8 (ref 5–15)
BUN: 13 mg/dL (ref 6–20)
CO2: 29 mmol/L (ref 22–32)
Calcium: 9.3 mg/dL (ref 8.9–10.3)
Chloride: 103 mmol/L (ref 98–111)
Creatinine, Ser: 0.88 mg/dL (ref 0.61–1.24)
GFR, Estimated: >60 mL/min (ref >60)
Glucose, Bld: 96 mg/dL (ref 70–99)
Potassium: 3.7 mmol/L (ref 3.5–5.1)
Sodium: 140 mmol/L (ref 135–145)
Total Bilirubin: 0.4 mg/dL (ref 0.3–1.2)
Total Protein: 7.2 g/dL (ref 6.5–8.1)

## 2021-01-14 LAB — CBC WITH DIFFERENTIAL/PLATELET
Abs Immature Granulocytes: 0.02 10*3/uL (ref 0.00–0.07)
Basophils Absolute: 0.1 10*3/uL (ref 0.0–0.1)
Basophils Relative: 1 %
Eosinophils Absolute: 0.2 10*3/uL (ref 0.0–0.5)
Eosinophils Relative: 3 %
HCT: 39.8 % (ref 39.0–52.0)
Hemoglobin: 12.4 g/dL — ABNORMAL LOW (ref 13.0–17.0)
Immature Granulocytes: 0 %
Lymphocytes Relative: 47 %
Lymphs Abs: 3.6 10*3/uL (ref 0.7–4.0)
MCH: 26.7 pg (ref 26.0–34.0)
MCHC: 31.2 g/dL (ref 30.0–36.0)
MCV: 85.6 fL (ref 80.0–100.0)
Monocytes Absolute: 0.8 10*3/uL (ref 0.1–1.0)
Monocytes Relative: 10 %
Neutro Abs: 3 10*3/uL (ref 1.7–7.7)
Neutrophils Relative %: 39 %
Platelets: 277 10*3/uL (ref 150–400)
RBC: 4.65 MIL/uL (ref 4.22–5.81)
RDW: 14 % (ref 11.5–15.5)
WBC: 7.7 10*3/uL (ref 4.0–10.5)
nRBC: 0 % (ref 0.0–0.2)

## 2021-01-14 LAB — RESP PANEL BY RT-PCR (FLU A&B, COVID) ARPGX2
Influenza A by PCR: NEGATIVE
Influenza B by PCR: NEGATIVE
SARS Coronavirus 2 by RT PCR: NEGATIVE

## 2021-01-14 LAB — LIPASE, BLOOD: Lipase: 26 U/L (ref 11–51)

## 2021-01-14 LAB — HIV ANTIBODY (ROUTINE TESTING W REFLEX): HIV Screen 4th Generation wRfx: NONREACTIVE

## 2021-01-14 SURGERY — REPAIR, HERNIA, UMBILICAL, ADULT
Anesthesia: General

## 2021-01-14 MED ORDER — METOPROLOL TARTRATE 5 MG/5ML IV SOLN: 5.0000 mg | Freq: Four times a day (QID) | INTRAVENOUS | Status: DC | PRN

## 2021-01-14 MED ORDER — MEPERIDINE HCL 50 MG/ML IJ SOLN: 6.2500 mg | INTRAMUSCULAR | Status: DC | PRN

## 2021-01-14 MED ORDER — ONDANSETRON 4 MG PO TBDP: 4.0000 mg | ORAL_TABLET | Freq: Four times a day (QID) | ORAL | Status: DC | PRN

## 2021-01-14 MED ORDER — MAGIC MOUTHWASH
15.0000 mL | Freq: Four times a day (QID) | ORAL | Status: DC | PRN
  Filled 2021-01-14: qty 15

## 2021-01-14 MED ORDER — ONDANSETRON HCL 4 MG/2ML IJ SOLN
INTRAMUSCULAR | Status: AC
  Filled 2021-01-14: qty 2

## 2021-01-14 MED ORDER — ENOXAPARIN SODIUM 40 MG/0.4ML IJ SOSY
40.0000 mg | PREFILLED_SYRINGE | INTRAMUSCULAR | Status: DC
  Administered 2021-01-14 (×2): 40 mg via SUBCUTANEOUS
  Filled 2021-01-14 (×2): qty 0.4

## 2021-01-14 MED ORDER — ONDANSETRON HCL 4 MG/2ML IJ SOLN
INTRAMUSCULAR | Status: DC | PRN
  Administered 2021-01-14: 4 mg via INTRAVENOUS

## 2021-01-14 MED ORDER — DIPHENHYDRAMINE HCL 12.5 MG/5ML PO ELIX: 12.5000 mg | ORAL_SOLUTION | Freq: Four times a day (QID) | ORAL | Status: DC | PRN

## 2021-01-14 MED ORDER — ACETAMINOPHEN 325 MG PO TABS: 325.0000 mg | ORAL_TABLET | Freq: Once | ORAL | Status: DC | PRN

## 2021-01-14 MED ORDER — SODIUM CHLORIDE 0.9% FLUSH: 3.0000 mL | INTRAVENOUS | Status: DC | PRN

## 2021-01-14 MED ORDER — LOSARTAN POTASSIUM 50 MG PO TABS
100.0000 mg | ORAL_TABLET | Freq: Every day | ORAL | Status: DC
  Administered 2021-01-14 – 2021-01-15 (×2): 100 mg via ORAL
  Filled 2021-01-14 (×2): qty 2

## 2021-01-14 MED ORDER — POLYETHYLENE GLYCOL 3350 17 G PO PACK
17.0000 g | PACK | Freq: Every day | ORAL | Status: DC
  Administered 2021-01-14: 17 g via ORAL
  Filled 2021-01-14 (×2): qty 1

## 2021-01-14 MED ORDER — SODIUM CHLORIDE 0.9 % IV SOLN: 250.0000 mL | INTRAVENOUS | Status: DC | PRN

## 2021-01-14 MED ORDER — PROMETHAZINE HCL 25 MG/ML IJ SOLN: 6.2500 mg | INTRAMUSCULAR | Status: DC | PRN

## 2021-01-14 MED ORDER — AMISULPRIDE (ANTIEMETIC) 5 MG/2ML IV SOLN
10.0000 mg | Freq: Once | INTRAVENOUS | Status: DC | PRN
Start: 2021-01-14 — End: 2021-01-14

## 2021-01-14 MED ORDER — ENOXAPARIN SODIUM 40 MG/0.4ML IJ SOSY: 40.0000 mg | PREFILLED_SYRINGE | INTRAMUSCULAR | Status: DC

## 2021-01-14 MED ORDER — LIDOCAINE 2% (20 MG/ML) 5 ML SYRINGE
INTRAMUSCULAR | Status: AC
  Filled 2021-01-14: qty 5

## 2021-01-14 MED ORDER — MIDAZOLAM HCL 2 MG/2ML IJ SOLN
INTRAMUSCULAR | Status: DC | PRN
  Administered 2021-01-14: 2 mg via INTRAVENOUS

## 2021-01-14 MED ORDER — ROCURONIUM BROMIDE 10 MG/ML (PF) SYRINGE
PREFILLED_SYRINGE | INTRAVENOUS | Status: AC
  Filled 2021-01-14: qty 10

## 2021-01-14 MED ORDER — SIMETHICONE 80 MG PO CHEW: 40.0000 mg | CHEWABLE_TABLET | Freq: Four times a day (QID) | ORAL | Status: DC | PRN

## 2021-01-14 MED ORDER — BUPIVACAINE LIPOSOME 1.3 % IJ SUSP
INTRAMUSCULAR | Status: DC | PRN
  Administered 2021-01-14: 20 mL

## 2021-01-14 MED ORDER — PROCHLORPERAZINE EDISYLATE 10 MG/2ML IJ SOLN: 5.0000 mg | Freq: Four times a day (QID) | INTRAMUSCULAR | Status: DC | PRN

## 2021-01-14 MED ORDER — PROCHLORPERAZINE MALEATE 10 MG PO TABS
10.0000 mg | ORAL_TABLET | Freq: Four times a day (QID) | ORAL | Status: DC | PRN
  Filled 2021-01-14: qty 1

## 2021-01-14 MED ORDER — CEFAZOLIN SODIUM-DEXTROSE 2-4 GM/100ML-% IV SOLN
2.0000 g | Freq: Three times a day (TID) | INTRAVENOUS | Status: DC
  Administered 2021-01-14 – 2021-01-15 (×3): 2 g via INTRAVENOUS
  Filled 2021-01-14 (×4): qty 100

## 2021-01-14 MED ORDER — PANTOPRAZOLE SODIUM 40 MG PO TBEC
40.0000 mg | DELAYED_RELEASE_TABLET | Freq: Every day | ORAL | Status: DC
  Administered 2021-01-14 – 2021-01-15 (×2): 40 mg via ORAL
  Filled 2021-01-14 (×2): qty 1

## 2021-01-14 MED ORDER — KETOROLAC TROMETHAMINE 30 MG/ML IJ SOLN
30.0000 mg | Freq: Once | INTRAMUSCULAR | Status: AC
  Administered 2021-01-14: 30 mg via INTRAVENOUS
  Filled 2021-01-14: qty 1

## 2021-01-14 MED ORDER — ROCURONIUM BROMIDE 10 MG/ML (PF) SYRINGE
PREFILLED_SYRINGE | INTRAVENOUS | Status: DC | PRN
  Administered 2021-01-14: 40 mg via INTRAVENOUS

## 2021-01-14 MED ORDER — SUCCINYLCHOLINE CHLORIDE 200 MG/10ML IV SOSY
PREFILLED_SYRINGE | INTRAVENOUS | Status: DC | PRN
  Administered 2021-01-14: 140 mg via INTRAVENOUS

## 2021-01-14 MED ORDER — DIPHENHYDRAMINE HCL 50 MG/ML IJ SOLN: 12.5000 mg | Freq: Four times a day (QID) | INTRAMUSCULAR | Status: DC | PRN

## 2021-01-14 MED ORDER — ENALAPRILAT 1.25 MG/ML IV SOLN
0.6250 mg | Freq: Four times a day (QID) | INTRAVENOUS | Status: DC | PRN
Start: 2021-01-14 — End: 2021-01-15
  Filled 2021-01-14: qty 1

## 2021-01-14 MED ORDER — BUPIVACAINE-EPINEPHRINE 0.25% -1:200000 IJ SOLN
INTRAMUSCULAR | Status: DC | PRN
  Administered 2021-01-14: 30 mL

## 2021-01-14 MED ORDER — BUPIVACAINE-EPINEPHRINE (PF) 0.25% -1:200000 IJ SOLN
INTRAMUSCULAR | Status: AC
  Filled 2021-01-14: qty 30

## 2021-01-14 MED ORDER — SENNOSIDES-DOCUSATE SODIUM 8.6-50 MG PO TABS: 2.0000 | ORAL_TABLET | Freq: Every evening | ORAL | Status: DC | PRN

## 2021-01-14 MED ORDER — ACETAMINOPHEN 500 MG PO TABS
1000.0000 mg | ORAL_TABLET | ORAL | Status: AC
Start: 2021-01-14 — End: 2021-01-14
  Administered 2021-01-14: 1000 mg via ORAL
  Filled 2021-01-14: qty 2

## 2021-01-14 MED ORDER — ONDANSETRON HCL 4 MG/2ML IJ SOLN: 4.0000 mg | Freq: Four times a day (QID) | INTRAMUSCULAR | Status: DC | PRN

## 2021-01-14 MED ORDER — LOSARTAN POTASSIUM-HCTZ 100-25 MG PO TABS: 1.0000 | ORAL_TABLET | Freq: Every day | ORAL | Status: DC

## 2021-01-14 MED ORDER — PANTOPRAZOLE SODIUM 40 MG PO TBEC: 40.0000 mg | DELAYED_RELEASE_TABLET | Freq: Every day | ORAL | 3 refills | Status: AC

## 2021-01-14 MED ORDER — ADULT MULTIVITAMIN W/MINERALS CH
1.0000 | ORAL_TABLET | Freq: Every day | ORAL | Status: DC
  Administered 2021-01-14 – 2021-01-15 (×2): 1 via ORAL
  Filled 2021-01-14 (×2): qty 1

## 2021-01-14 MED ORDER — GABAPENTIN 100 MG PO CAPS
200.0000 mg | ORAL_CAPSULE | Freq: Three times a day (TID) | ORAL | Status: DC
  Administered 2021-01-14 – 2021-01-15 (×4): 200 mg via ORAL
  Filled 2021-01-14 (×4): qty 2

## 2021-01-14 MED ORDER — DEXAMETHASONE SODIUM PHOSPHATE 10 MG/ML IJ SOLN
INTRAMUSCULAR | Status: DC | PRN
  Administered 2021-01-14: 10 mg via INTRAVENOUS

## 2021-01-14 MED ORDER — FENTANYL CITRATE (PF) 250 MCG/5ML IJ SOLN
INTRAMUSCULAR | Status: DC | PRN
  Administered 2021-01-14 (×2): 100 ug via INTRAVENOUS

## 2021-01-14 MED ORDER — CHLORHEXIDINE GLUCONATE CLOTH 2 % EX PADS
6.0000 | MEDICATED_PAD | Freq: Once | CUTANEOUS | Status: AC
  Administered 2021-01-14: 6 via TOPICAL

## 2021-01-14 MED ORDER — FENTANYL CITRATE (PF) 250 MCG/5ML IJ SOLN
INTRAMUSCULAR | Status: AC
  Filled 2021-01-14: qty 5

## 2021-01-14 MED ORDER — ACETAMINOPHEN 160 MG/5ML PO SOLN: 325.0000 mg | Freq: Once | ORAL | Status: DC | PRN

## 2021-01-14 MED ORDER — LACTATED RINGERS IV SOLN: INTRAVENOUS | Status: DC

## 2021-01-14 MED ORDER — HYDROMORPHONE HCL 1 MG/ML IJ SOLN: 0.5000 mg | INTRAMUSCULAR | Status: DC | PRN

## 2021-01-14 MED ORDER — MENTHOL 3 MG MT LOZG: 1.0000 | LOZENGE | OROMUCOSAL | Status: DC | PRN

## 2021-01-14 MED ORDER — BISACODYL 10 MG RE SUPP: 10.0000 mg | Freq: Two times a day (BID) | RECTAL | Status: DC | PRN

## 2021-01-14 MED ORDER — PROPOFOL 10 MG/ML IV BOLUS
INTRAVENOUS | Status: AC
  Filled 2021-01-14: qty 20

## 2021-01-14 MED ORDER — ALUM & MAG HYDROXIDE-SIMETH 200-200-20 MG/5ML PO SUSP: 30.0000 mL | Freq: Four times a day (QID) | ORAL | Status: DC | PRN

## 2021-01-14 MED ORDER — ACETAMINOPHEN 500 MG PO TABS
1000.0000 mg | ORAL_TABLET | Freq: Four times a day (QID) | ORAL | Status: DC
  Administered 2021-01-14 – 2021-01-15 (×4): 1000 mg via ORAL
  Filled 2021-01-14 (×4): qty 2

## 2021-01-14 MED ORDER — TRAMADOL HCL 50 MG PO TABS
50.0000 mg | ORAL_TABLET | Freq: Four times a day (QID) | ORAL | Status: DC | PRN
  Administered 2021-01-15: 100 mg via ORAL
  Filled 2021-01-14 (×2): qty 2

## 2021-01-14 MED ORDER — SODIUM CHLORIDE 0.9 % IV SOLN
2.0000 g | INTRAVENOUS | Status: AC
  Administered 2021-01-14: 2 g via INTRAVENOUS
  Filled 2021-01-14: qty 2

## 2021-01-14 MED ORDER — PHENOL 1.4 % MT LIQD
2.0000 | OROMUCOSAL | Status: DC | PRN
  Filled 2021-01-14: qty 177

## 2021-01-14 MED ORDER — SODIUM CHLORIDE (PF) 0.9 % IJ SOLN
INTRAMUSCULAR | Status: AC
  Filled 2021-01-14: qty 50

## 2021-01-14 MED ORDER — HYDROMORPHONE HCL 1 MG/ML IJ SOLN: 0.2500 mg | INTRAMUSCULAR | Status: DC | PRN

## 2021-01-14 MED ORDER — LACTATED RINGERS IV BOLUS: 1000.0000 mL | Freq: Three times a day (TID) | INTRAVENOUS | Status: DC | PRN

## 2021-01-14 MED ORDER — DEXAMETHASONE SODIUM PHOSPHATE 10 MG/ML IJ SOLN
INTRAMUSCULAR | Status: AC
  Filled 2021-01-14: qty 1

## 2021-01-14 MED ORDER — LACTATED RINGERS IV SOLN: INTRAVENOUS | Status: DC | PRN

## 2021-01-14 MED ORDER — SUGAMMADEX SODIUM 200 MG/2ML IV SOLN
INTRAVENOUS | Status: DC | PRN
  Administered 2021-01-14: 200 mg via INTRAVENOUS

## 2021-01-14 MED ORDER — POLYETHYLENE GLYCOL 3350 17 G PO PACK: 17.0000 g | PACK | Freq: Two times a day (BID) | ORAL | Status: DC | PRN

## 2021-01-14 MED ORDER — PROPOFOL 10 MG/ML IV BOLUS
INTRAVENOUS | Status: DC | PRN
  Administered 2021-01-14: 160 mg via INTRAVENOUS

## 2021-01-14 MED ORDER — SUCCINYLCHOLINE CHLORIDE 200 MG/10ML IV SOSY
PREFILLED_SYRINGE | INTRAVENOUS | Status: AC
  Filled 2021-01-14: qty 10

## 2021-01-14 MED ORDER — ACETAMINOPHEN 10 MG/ML IV SOLN: 1000.0000 mg | Freq: Once | INTRAVENOUS | Status: DC | PRN

## 2021-01-14 MED ORDER — SODIUM CHLORIDE 0.9% FLUSH
3.0000 mL | Freq: Two times a day (BID) | INTRAVENOUS | Status: DC
  Administered 2021-01-14: 3 mL via INTRAVENOUS

## 2021-01-14 MED ORDER — IOHEXOL 300 MG/ML  SOLN
100.0000 mL | Freq: Once | INTRAMUSCULAR | Status: AC | PRN
  Administered 2021-01-14: 100 mL via INTRAVENOUS

## 2021-01-14 MED ORDER — SODIUM CHLORIDE 0.9 % IV SOLN: Freq: Three times a day (TID) | INTRAVENOUS | Status: DC | PRN

## 2021-01-14 MED ORDER — BUPIVACAINE LIPOSOME 1.3 % IJ SUSP
20.0000 mL | Freq: Once | INTRAMUSCULAR | Status: DC
  Filled 2021-01-14: qty 20

## 2021-01-14 MED ORDER — MIDAZOLAM HCL 2 MG/2ML IJ SOLN
INTRAMUSCULAR | Status: AC
  Filled 2021-01-14: qty 2

## 2021-01-14 MED ORDER — LIDOCAINE HCL (CARDIAC) PF 100 MG/5ML IV SOSY
PREFILLED_SYRINGE | INTRAVENOUS | Status: DC | PRN
  Administered 2021-01-14: 100 mg via INTRAVENOUS

## 2021-01-14 MED ORDER — HYDROCHLOROTHIAZIDE 25 MG PO TABS
25.0000 mg | ORAL_TABLET | Freq: Every day | ORAL | Status: DC
  Administered 2021-01-14 – 2021-01-15 (×2): 25 mg via ORAL
  Filled 2021-01-14 (×2): qty 1

## 2021-01-14 MED ORDER — METHOCARBAMOL 500 MG PO TABS: 500.0000 mg | ORAL_TABLET | Freq: Four times a day (QID) | ORAL | Status: DC | PRN

## 2021-01-14 MED ORDER — AMLODIPINE BESYLATE 5 MG PO TABS
5.0000 mg | ORAL_TABLET | Freq: Every day | ORAL | Status: DC
  Administered 2021-01-14 – 2021-01-15 (×2): 5 mg via ORAL
  Filled 2021-01-14 (×2): qty 1

## 2021-01-14 SURGICAL SUPPLY — 13 items
DECANTER SPIKE VIAL GLASS SM (MISCELLANEOUS) ×3 IMPLANT
DRAPE LAPAROTOMY T 98X78 PEDS (DRAPES) ×3 IMPLANT
DRSG TEGADERM 6X8 (GAUZE/BANDAGES/DRESSINGS) ×3 IMPLANT
GAUZE SPONGE 4X4 12PLY STRL (GAUZE/BANDAGES/DRESSINGS) ×3 IMPLANT
GLOVE SURG ENC MOIS LTX SZ8 (GLOVE) ×3 IMPLANT
GOWN STRL REUS W/TWL XL LVL3 (GOWN DISPOSABLE) ×6 IMPLANT
KIT BASIN OR (CUSTOM PROCEDURE TRAY) ×3 IMPLANT
NEEDLE HYPO 22GX1.5 SAFETY (NEEDLE) ×3 IMPLANT
PACK GENERAL/GYN (CUSTOM PROCEDURE TRAY) ×3 IMPLANT
SUT MNCRL AB 4-0 PS2 18 (SUTURE) ×3 IMPLANT
SUT PDS AB 1 CT1 27 (SUTURE) ×9 IMPLANT
SUT VIC AB 2-0 SH 18 (SUTURE) ×3 IMPLANT
TAPE UMBILICAL 1/8X18 (MISCELLANEOUS) ×3 IMPLANT

## 2021-01-14 NOTE — Anesthesia Postprocedure Evaluation (Signed)
Anesthesia Post Note  Patient: Fernando Cook  Procedure(s) Performed: REDUCTION AND PRIMARY REPAIR OF INCARCERATED HERNIA     Patient location during evaluation: PACU Anesthesia Type: General Level of consciousness: awake and alert, oriented and patient cooperative Pain management: pain level controlled Vital Signs Assessment: post-procedure vital signs reviewed and stable Respiratory status: spontaneous breathing, nonlabored ventilation and respiratory function stable Cardiovascular status: blood pressure returned to baseline and stable Postop Assessment: no apparent nausea or vomiting Anesthetic complications: no   No notable events documented.  Last Vitals:  Vitals:   01/14/21 0730 01/14/21 0756  BP: 131/89 134/89  Pulse: (!) 50 (!) 52  Resp: 17 16  Temp: 36.7 C 36.6 C  SpO2: 99% 97%    Last Pain:  Vitals:   01/14/21 0756  TempSrc: Oral  PainSc: [name]

## 2021-01-14 NOTE — Transfer of Care (Signed)
Immediate Anesthesia Transfer of Care Note  Patient: Fernando Cook  Procedure(s) Performed: REDUCTION AND PRIMARY REPAIR OF INCARCERATED HERNIA  Patient Location: PACU  Anesthesia Type:General  Level of Consciousness: drowsy and patient cooperative  Airway & Oxygen Therapy: Patient Spontanous Breathing and Patient connected to face mask oxygen  Post-op Assessment: Report given to RN and Post -op Vital signs reviewed and stable  Post vital signs: Reviewed and stable  Last Vitals:  Vitals Value Taken Time  BP 124/83 01/14/21 0645  Temp    Pulse 53 01/14/21 0647  Resp 17 01/14/21 0647  SpO2 100 % 01/14/21 0647  Vitals shown include unvalidated device data.  Last Pain:  Vitals:   01/13/21 2347  TempSrc:   PainSc: 10-Worst pain ever         Complications: No notable events documented.

## 2021-01-14 NOTE — ED Notes (Addendum)
When trying to get this patient to PACU, the patient's belongings were refused by the PACU staff. Belongings were given to security and a key was given to the patient for the belongings.

## 2021-01-14 NOTE — Anesthesia Preprocedure Evaluation (Signed)
Anesthesia Evaluation  Patient identified by MRN, date of birth, ID band Patient awake    Reviewed: Allergy & Precautions, NPO status , Patient's Chart, lab work & pertinent test results  Airway Mallampati: III  TM Distance: >3 FB Neck ROM: Full    Dental  (+) Chipped,    Pulmonary neg pulmonary ROS,    Pulmonary exam normal        Cardiovascular hypertension, Pt. on medications  Rhythm:Regular Rate:Normal     Neuro/Psych negative neurological ROS  negative psych ROS   GI/Hepatic Neg liver ROS, hiatal hernia, PUD, GERD  Medicated,  Endo/Other  negative endocrine ROS  Renal/GU negative Renal ROS     Musculoskeletal   Abdominal Normal abdominal exam  (+)   Peds  Hematology negative hematology ROS (+)   Anesthesia Other Findings   Reproductive/Obstetrics                             Anesthesia Physical Anesthesia Plan  ASA: 2 and emergent  Anesthesia Plan: General   Post-op Pain Management:    Induction: Intravenous  PONV Risk Score and Plan: 3 and Dexamethasone, Midazolam and Ondansetron  Airway Management Planned: Oral ETT  Additional Equipment: None  Intra-op Plan:   Post-operative Plan: Extubation in OR  Informed Consent: I have reviewed the patients History and Physical, chart, labs and discussed the procedure including the risks, benefits and alternatives for the proposed anesthesia with the patient or authorized representative who has indicated his/her understanding and acceptance.       Plan Discussed with: CRNA  Anesthesia Plan Comments: (Called @ 450.   Lab Results      Component                Value               Date                      WBC                      7.7                 01/14/2021                HGB                      12.4 (L)            01/14/2021                HCT                      39.8                01/14/2021                MCV                       85.6                01/14/2021                PLT                      277  01/14/2021           )        Anesthesia Quick Evaluation

## 2021-01-14 NOTE — ED Provider Notes (Signed)
[facility] DEPT Provider Note   CSN: [id number] Arrival date & time: 01/13/21  2334     History Chief Complaint  Patient presents with   Umbilical Hernia    Fernando Cook is a 55 y.o. male.  The history is provided by the patient and medical records.   55 year old male with history of hypertension, former history of substance abuse, presenting to the ED with complaint of painful hernia at his navel.  States that this hernia for about 8 months or so but has always been soft and he is able to "push it back in".  He did have some car speakers 2 days ago and has been having persistent pain since.  He has noticed today that his hernia feels "hard" and he has not been able to push it back in.  It is also starting to turn red.  He has not had any vomiting or trouble with bowel movements.  He has not developed any fevers.  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  Past Medical History:  Diagnosis Date   Hiatal hernia    Hypertension    Substance abuse ([place])    ETOH/ stopped drinking nov. 2020    Patient Active Problem List   Diagnosis Date Noted   Hypertension 09/08/2019    Past Surgical History:  Procedure Laterality Date   NO PAST SURGERIES         Family History  Problem Relation Age of Onset   Hypertension Mother    Hyperlipidemia Mother    Prostate cancer Father    Esophageal cancer Neg Hx    Colon cancer Neg Hx    Stomach cancer Neg Hx    Pancreatic cancer Neg Hx     Social History   Tobacco Use   Smoking status: Never   Smokeless tobacco: Never  Vaping Use   Vaping Use: Never used  Substance Use Topics   Alcohol use: Not Currently   Drug use: Not Currently    Home Medications Prior to Admission medications   Medication Sig Start Date End Date Taking? Authorizing Provider  amLODipine (NORVASC) 5 MG tablet TAKE 1 TABLET (5 MG TOTAL) BY MOUTH DAILY. 10/03/20 10/03/21   Gildardo Pounds, NP  celecoxib (CELEBREX) 200 MG capsule TAKE 1 CAPSULE (200 MG TOTAL) BY MOUTH DAILY AS NEEDED FOR PAIN 09/27/20 09/27/21  Charlott Rakes, MD  celecoxib (CELEBREX) 200 MG capsule TAKE 1 CAPSULE (200 MG TOTAL) BY MOUTH DAILY AS NEEDED FOR PAIN 04/09/20 04/09/21  Charlott Rakes, MD  celecoxib (CELEBREX) 200 MG capsule TAKE 1 CAPSULE BY MOUTH ONCE DAILY AS NEEDED FOR PAIN 11/20/20   Charlott Rakes, MD  cetirizine (ZYRTEC) 10 MG tablet TAKE 1 TABLET (10 MG TOTAL) BY MOUTH DAILY. 07/23/20 07/23/21  Charlott Rakes, MD  losartan-hydrochlorothiazide (HYZAAR) 100-25 MG tablet TAKE 1 TABLET BY MOUTH DAILY. 10/03/20 10/03/21  Gildardo Pounds, NP  pantoprazole (PROTONIX) 40 MG tablet TAKE 1 TABLET (40 MG TOTAL) BY MOUTH 2 (TWO) TIMES DAILY BEFORE A MEAL. 02/29/20 02/28/21  Argentina Donovan, PA-C  senna-docusate (SENOKOT-S) 8.6-50 MG tablet TAKE 2 TABLETS BY MOUTH DAILY. 10/03/20 10/03/21  Gildardo Pounds, NP  Vitamin D, Ergocalciferol, (DRISDOL) 1.25 MG (50000 UNIT) CAPS capsule Take 1 capsule (50,000 Units total) by mouth every 7 (seven) days. 03/01/20   Argentina Donovan, PA-C  omeprazole (PRILOSEC) 20 MG capsule Take 1 capsule (20 mg total) by mouth daily. Patient  not taking: Reported on 09/26/2019 07/15/19 01/10/20  Wyvonnia Dusky, MD  sucralfate (CARAFATE) 1 g tablet Take 1 tablet (1 g total) by mouth 4 (four) times daily -  with meals and at bedtime. Patient not taking: Reported on 09/26/2019 08/15/19 01/10/20  Mauri Pole, MD    Allergies    Patient has no known allergies.  Review of Systems   Review of Systems  Gastrointestinal:  Positive for abdominal pain.  All other systems reviewed and are negative.  Physical Exam Updated Vital Signs BP (!) 133/94   Pulse 68   Temp 99 F (37.2 C) (Oral)   Resp 16   Ht 5\' 7"  (1.702 m)   Wt 104.8 kg   SpO2 94%   BMI 36.18 kg/m   Physical Exam Vitals and nursing note reviewed.  Constitutional:      Appearance: He is well-developed.   HENT:     Head: Normocephalic and atraumatic.  Eyes:     Conjunctiva/sclera: Conjunctivae normal.     Pupils: Pupils are equal, round, and reactive to light.  Cardiovascular:     Rate and Rhythm: Normal rate and regular rhythm.     Heart sounds: Normal heart sounds.  Pulmonary:     Effort: Pulmonary effort is normal.     Breath sounds: Normal breath sounds.  Abdominal:     General: Bowel sounds are normal.     Palpations: Abdomen is soft.     Comments: Umbilical hernia noted, firm and not able to reduce, some mild overlying erythema  Musculoskeletal:        General: Normal range of motion.     Cervical back: Normal range of motion.  Skin:    General: Skin is warm and dry.  Neurological:     Mental Status: He is alert and oriented to person, place, and time.    ED Results / Procedures / Treatments   Labs (all labs ordered are listed, but only abnormal results are displayed) Labs Reviewed  CBC WITH DIFFERENTIAL/PLATELET - Abnormal; Notable for the following components:      Result Value   Hemoglobin 12.4 (*)    All other components within normal limits  RESP PANEL BY RT-PCR (FLU A&B, COVID) ARPGX2  COMPREHENSIVE METABOLIC PANEL  LIPASE, BLOOD  URINALYSIS, ROUTINE W REFLEX MICROSCOPIC    EKG None  Radiology CT ABDOMEN PELVIS W CONTRAST  Result Date: 01/14/2021 CLINICAL DATA:  Abdominal pain, umbilical hernia EXAM: CT ABDOMEN AND PELVIS WITH CONTRAST TECHNIQUE: Multidetector CT imaging of the abdomen and pelvis was performed using the standard protocol following bolus administration of intravenous contrast. CONTRAST:  186mL OMNIPAQUE IOHEXOL 300 MG/ML  SOLN COMPARISON:  None. FINDINGS: Lower chest: The visualized lung bases are clear bilaterally. The visualized heart and pericardium are unremarkable. Hepatobiliary: No focal liver abnormality is seen. No gallstones, gallbladder wall thickening, or biliary dilatation. Pancreas: Unremarkable Spleen: Unremarkable  Adrenals/Urinary Tract: The adrenal glands are unremarkable. The kidneys are normal in size and position. Bilateral simple cortical cysts are identified. A 19 mm exophytic low-attenuation lesion arises from the interpolar region of the right kidney possibly representing a hyperdense renal cyst, though this is not optimally characterized on this single-phase examination. The kidneys are otherwise unremarkable. The bladder is unremarkable. Stomach/Bowel: Stomach is within normal limits. Appendix appears normal. No evidence of bowel wall thickening, distention, or inflammatory changes. No free intraperitoneal gas or fluid. Vascular/Lymphatic: No significant vascular findings are present. No enlarged abdominal or pelvic lymph nodes. Reproductive: Prostate is  unremarkable. Other: A moderate fat containing umbilical hernia is present containing herniated omental fat. Additionally, there is small fluid within the hernia sac as well as infiltration of the herniated fat suggesting changes of incarceration. The umbilical defect measures 1.9 x 2.0 cm. The hernia sac itself measures 4.3 x 5.4 x 5.8 cm. The rectum is unremarkable. Musculoskeletal: Degenerative changes are seen within the lumbar spine. No acute bone abnormality. IMPRESSION: Moderate fat containing umbilical hernia with fluid within the hernia sac and infiltration of the herniated fat suggesting changes of incarceration. 18 mm indeterminate hyperdense exophytic lesion arising from the interpolar region of the right kidney possibly representing a hyperdense renal cyst. This could be confirmed with dedicated renal sonography. Electronically Signed   By: Fidela Salisbury MD   On: 01/14/2021 03:15    Procedures Procedures   CRITICAL CARE Performed by: Larene Pickett   Total critical care time: 35 minutes  Critical care time was exclusive of separately billable procedures and treating other patients.  Critical care was necessary to treat or prevent imminent  or life-threatening deterioration.  Critical care was time spent personally by me on the following activities: development of treatment plan with patient and/or surrogate as well as nursing, discussions with consultants, evaluation of patient's response to treatment, examination of patient, obtaining history from patient or surrogate, ordering and performing treatments and interventions, ordering and review of laboratory studies, ordering and review of radiographic studies, pulse oximetry and re-evaluation of patient's condition.   Medications Ordered in ED Medications - No data to display  ED Course  I have reviewed the triage vital signs and the nursing notes.  Pertinent labs & imaging results that were available during my care of the patient were reviewed by me and considered in my medical decision making (see chart for details).    MDM Rules/Calculators/A&P   55 year old male presenting to the ED with new pain around umbilical hernia.  Has been present for several months but has usually been soft and reducible.  He moved to set of car speakers 2 days ago and since then has had ongoing pain.  On exam, umbilical hernia is firm to the touch and I am not able to reduce this.  There is some overlying erythema present.  Concern for incarceration.  We will plan for labs, CT.  Patient requested no narcotic medication as he is currently in recovery.  If creatinine allows, will give dose of Toradol.  Labs overall reassuring.  CT is concerning for incarcerated hernia.  Discussed with on-call general surgery, Dr. Johney Maine, who is evaluated patient in the ED and will take to the OR emergently for surgical intervention.  Final Clinical Impression(s) / ED Diagnoses Final diagnoses:  Incarcerated hernia    Rx / DC Orders ED Discharge Orders     None        Larene Pickett, PA-C 90/24/09 [id number]    Delora Fuel, MD 29/92/42 [phone]

## 2021-01-14 NOTE — Anesthesia Procedure Notes (Signed)
Procedure Name: Intubation Date/Time: 01/14/2021 5:47 AM Performed by: Raenette Rover, CRNA Pre-anesthesia Checklist: Patient identified, Emergency Drugs available, Suction available and Patient being monitored Patient Re-evaluated:Patient Re-evaluated prior to induction Oxygen Delivery Method: Circle system utilized Preoxygenation: Pre-oxygenation with 100% oxygen Induction Type: IV induction, Rapid sequence and Cricoid Pressure applied Laryngoscope Size: Mac and 4 Grade View: Grade II Tube type: Oral Tube size: 7.5 mm Number of attempts: 1 Airway Equipment and Method: Stylet Placement Confirmation: ETT inserted through vocal cords under direct vision, positive ETCO2 and breath sounds checked- equal and bilateral Secured at: 23 cm Tube secured with: Tape Dental Injury: Teeth and Oropharynx as per pre-operative assessment

## 2021-01-14 NOTE — Interval H&P Note (Signed)
History and Physical Interval Note:  01/14/2021 4:49 AM  Fernando Cook  has presented today for surgery, with the diagnosis of peri umbilical incarcerated hernia.  The various methods of treatment have been discussed with the patient and family. After consideration of risks, benefits and other options for treatment, the patient has consented to repair of incarcerated ventral hernia with abdominal exploration as a surgical intervention.  The patient's history has been reviewed, patient examined, no change in status, stable for surgery.  I have reviewed the patient's chart and labs.  Questions were answered to the patient's satisfaction.    I have re-reviewed the the patient's records, history, medications, and allergies.  I have re-examined the patient.  I again discussed intraoperative plans and goals of post-operative recovery.  The patient agrees to proceed.  Fernando Cook  09/16/1965 [id number]  Patient Care Team: Charlott Rakes, MD as PCP - General (Family Medicine) Mauri Pole, MD as Consulting Physician (Gastroenterology) Dorna Leitz, MD as Consulting Physician (Orthopedic Surgery)  Patient Active Problem List   Diagnosis Date Noted   Obesity (BMI 30-39.9) 01/14/2021   Incarcerated ventral hernia 01/14/2021   Gastric ulcer 01/14/2021   Hiatal hernia 01/14/2021   Esophagitis determined by endoscopy 01/14/2021   History of alcohol abuse 01/14/2021   History of noncompliance with medical treatment 01/14/2021   Family history of prostate cancer in father 01/14/2021   Prediabetes 01/14/2021   Hypertension 09/08/2019    Past Medical History:  Diagnosis Date   Esophagitis determined by endoscopy 01/14/2021   Family history of prostate cancer in father 01/14/2021   Hiatal hernia    History of alcohol abuse 01/14/2021   History of noncompliance with medical treatment 01/14/2021   Hypertension    Incarcerated ventral hernia 01/14/2021   Obesity (BMI 30-39.9) 01/14/2021    Substance abuse ([place])    ETOH/ stopped drinking nov. 2020    Past Surgical History:  Procedure Laterality Date   NO PAST SURGERIES      Social History   Socioeconomic History   Marital status: Single    Spouse name: Not on file   Number of children: Not on file   Years of education: Not on file   Highest education level: Not on file  Occupational History   Not on file  Tobacco Use   Smoking status: Never   Smokeless tobacco: Never  Vaping Use   Vaping Use: Never used  Substance and Sexual Activity   Alcohol use: Not Currently   Drug use: Not Currently   Sexual activity: Not on file  Other Topics Concern   Not on file  Social History Narrative   Not on file   Social Determinants of Health   Financial Resource Strain: Not on file  Food Insecurity: Not on file  Transportation Needs: Not on file  Physical Activity: Not on file  Stress: Not on file  Social Connections: Not on file  Intimate Partner Violence: Not on file    Family History  Problem Relation Age of Onset   Hypertension Mother    Hyperlipidemia Mother    Prostate cancer Father    Esophageal cancer Neg Hx    Colon cancer Neg Hx    Stomach cancer Neg Hx    Pancreatic cancer Neg Hx     (Not in a hospital admission)   Current Facility-Administered Medications  Medication Dose Route Frequency Provider Last Rate Last Admin   acetaminophen (TYLENOL) tablet 1,000 mg  1,000 mg Oral On Call to OR  Michael Boston, MD       cefoTEtan (CEFOTAN) 2 g in sodium chloride 0.9 % 100 mL IVPB  2 g Intravenous On Call to OR Michael Boston, MD       Chlorhexidine Gluconate Cloth 2 % PADS 6 each  6 each Topical Once Michael Boston, MD       sodium chloride (PF) 0.9 % injection            Current Outpatient Medications  Medication Sig Dispense Refill   amLODipine (NORVASC) 5 MG tablet TAKE 1 TABLET (5 MG TOTAL) BY MOUTH DAILY. 90 tablet 3   losartan-hydrochlorothiazide (HYZAAR) 100-25 MG tablet TAKE 1 TABLET BY MOUTH  DAILY. 90 tablet 1   Multiple Vitamin (ONE-A-DAY MENS PO) Take 1 tablet by mouth daily.     pantoprazole (PROTONIX) 40 MG tablet TAKE 1 TABLET (40 MG TOTAL) BY MOUTH 2 (TWO) TIMES DAILY BEFORE A MEAL. (Patient taking differently: Take 40 mg by mouth daily as needed.) 90 tablet 3   senna-docusate (SENOKOT-S) 8.6-50 MG tablet TAKE 2 TABLETS BY MOUTH DAILY. (Patient taking differently: Take 2 tablets by mouth daily as needed.) 60 tablet 2   traMADol (ULTRAM) 50 MG tablet Take 50 mg by mouth 3 (three) times daily as needed.       Allergies  Allergen Reactions   Nsaids Other (See Comments)    Gastric ulcers    BP 123/88   Pulse 60   Temp 99 F (37.2 C) (Oral)   Resp 18   Ht 5\' 7"  (1.702 m)   Wt 104.8 kg   SpO2 95%   BMI 36.18 kg/m   Labs: Results for orders placed or performed during the hospital encounter of 01/13/21 (from the past 48 hour(s))  CBC with Differential     Status: Abnormal   Collection Time: 01/14/21  1:20 AM  Result Value Ref Range   WBC 7.7 4.0 - 10.5 K/uL   RBC 4.65 4.22 - 5.81 MIL/uL   Hemoglobin 12.4 (L) 13.0 - 17.0 g/dL   HCT 39.8 39.0 - 52.0 %   MCV 85.6 80.0 - 100.0 fL   MCH 26.7 26.0 - 34.0 pg   MCHC 31.2 30.0 - 36.0 g/dL   RDW 14.0 11.5 - 15.5 %   Platelets 277 150 - 400 K/uL   nRBC 0.0 0.0 - 0.2 %   Neutrophils Relative % 39 %   Neutro Abs 3.0 1.7 - 7.7 K/uL   Lymphocytes Relative 47 %   Lymphs Abs 3.6 0.7 - 4.0 K/uL   Monocytes Relative 10 %   Monocytes Absolute 0.8 0.1 - 1.0 K/uL   Eosinophils Relative 3 %   Eosinophils Absolute 0.2 0.0 - 0.5 K/uL   Basophils Relative 1 %   Basophils Absolute 0.1 0.0 - 0.1 K/uL   Immature Granulocytes 0 %   Abs Immature Granulocytes 0.02 0.00 - 0.07 K/uL    Comment: Performed at [facility], [place] [address], [place], [place] [address]  Comprehensive metabolic panel     Status: None   Collection Time: 01/14/21  1:20 AM  Result Value Ref Range   Sodium 140 135 - 145 mmol/L   Potassium 3.7  3.5 - 5.1 mmol/L   Chloride 103 98 - 111 mmol/L   CO2 29 22 - 32 mmol/L   Glucose, Bld 96 70 - 99 mg/dL    Comment: Glucose reference range applies only to samples taken after fasting for at least 8 hours.   BUN  13 6 - 20 mg/dL   Creatinine, Ser 0.88 0.61 - 1.24 mg/dL   Calcium 9.3 8.9 - 10.3 mg/dL   Total Protein 7.2 6.5 - 8.1 g/dL   Albumin 3.7 3.5 - 5.0 g/dL   AST 20 15 - 41 U/L   ALT 20 0 - 44 U/L   Alkaline Phosphatase 58 38 - 126 U/L   Total Bilirubin 0.4 0.3 - 1.2 mg/dL   GFR, Estimated >60 >60 mL/min    Comment: (NOTE) Calculated using the CKD-EPI Creatinine Equation (2021)    Anion gap 8 5 - 15    Comment: Performed at [facility], [place] [address], [place], [place] [address]  Lipase, blood     Status: None   Collection Time: 01/14/21  1:20 AM  Result Value Ref Range   Lipase 26 11 - 51 U/L    Comment: Performed at [facility], [place] [address] [place], [place] [address]  Urinalysis, Routine w reflex microscopic     Status: None   Collection Time: 01/14/21  1:25 AM  Result Value Ref Range   Color, Urine YELLOW YELLOW   APPearance CLEAR CLEAR   Specific Gravity, Urine 1.021 1.005 - 1.030   pH 5.0 5.0 - 8.0   Glucose, UA NEGATIVE NEGATIVE mg/dL   Hgb urine dipstick NEGATIVE NEGATIVE   Bilirubin Urine NEGATIVE NEGATIVE   Ketones, ur NEGATIVE NEGATIVE mg/dL   Protein, ur NEGATIVE NEGATIVE mg/dL   Nitrite NEGATIVE NEGATIVE   Leukocytes,Ua NEGATIVE NEGATIVE    Comment: Performed at [place] [address] [place], [place] [address]  Resp Panel by RT-PCR (Flu A&B, Covid) Nasopharyngeal Swab     Status: None   Collection Time: 01/14/21  1:25 AM   Specimen: Nasopharyngeal Swab; Nasopharyngeal(NP) swabs in vial transport medium  Result Value Ref Range   SARS Coronavirus 2 by RT PCR NEGATIVE NEGATIVE    Comment: (NOTE) SARS-CoV-2 target nucleic acids are NOT DETECTED.  The SARS-CoV-2 RNA is generally  detectable in upper respiratory specimens during the acute phase of infection. The lowest concentration of SARS-CoV-2 viral copies this assay can detect is 138 copies/mL. A negative result does not preclude SARS-Cov-2 infection and should not be used as the sole basis for treatment or other patient management decisions. A negative result may occur with  improper specimen collection/handling, submission of specimen other than nasopharyngeal swab, presence of viral mutation(s) within the areas targeted by this assay, and inadequate number of viral copies(<138 copies/mL). A negative result must be combined with clinical observations, patient history, and epidemiological information. The expected result is Negative.  Fact Sheet for Patients:  EntrepreneurPulse.com.au  Fact Sheet for Healthcare Providers:  IncredibleEmployment.be  This test is no t yet approved or cleared by the [place] FDA and  has been authorized for detection and/or diagnosis of SARS-CoV-2 by FDA under an Emergency Use Authorization (EUA). This EUA will remain  in effect (meaning this test can be used) for the duration of the COVID-19 declaration under Section 564(b)(1) of the Act, 21 U.S.C.section 360bbb-3(b)(1), unless the authorization is terminated  or revoked sooner.       Influenza A by PCR NEGATIVE NEGATIVE   Influenza B by PCR NEGATIVE NEGATIVE    Comment: (NOTE) The Xpert Xpress SARS-CoV-2/FLU/RSV plus assay is intended as an aid in the diagnosis of influenza from Nasopharyngeal swab specimens and should not be used as a sole basis for treatment. Nasal washings and aspirates are unacceptable for  Xpert Xpress SARS-CoV-2/FLU/RSV testing.  Fact Sheet for Patients: EntrepreneurPulse.com.au  Fact Sheet for Healthcare Providers: IncredibleEmployment.be  This test is not yet approved or cleared by the [place] FDA and has  been authorized for detection and/or diagnosis of SARS-CoV-2 by FDA under an Emergency Use Authorization (EUA). This EUA will remain in effect (meaning this test can be used) for the duration of the COVID-19 declaration under Section 564(b)(1) of the Act, 21 U.S.C. section 360bbb-3(b)(1), unless the authorization is terminated or revoked.  Performed at [facility], [place] [address] [place], [place] [address]     Imaging / Studies: CT ABDOMEN PELVIS W CONTRAST  Result Date: 01/14/2021 CLINICAL DATA:  Abdominal pain, umbilical hernia EXAM: CT ABDOMEN AND PELVIS WITH CONTRAST TECHNIQUE: Multidetector CT imaging of the abdomen and pelvis was performed using the standard protocol following bolus administration of intravenous contrast. CONTRAST:  133mL OMNIPAQUE IOHEXOL 300 MG/ML  SOLN COMPARISON:  None. FINDINGS: Lower chest: The visualized lung bases are clear bilaterally. The visualized heart and pericardium are unremarkable. Hepatobiliary: No focal liver abnormality is seen. No gallstones, gallbladder wall thickening, or biliary dilatation. Pancreas: Unremarkable Spleen: Unremarkable Adrenals/Urinary Tract: The adrenal glands are unremarkable. The kidneys are normal in size and position. Bilateral simple cortical cysts are identified. A 19 mm exophytic low-attenuation lesion arises from the interpolar region of the right kidney possibly representing a hyperdense renal cyst, though this is not optimally characterized on this single-phase examination. The kidneys are otherwise unremarkable. The bladder is unremarkable. Stomach/Bowel: Stomach is within normal limits. Appendix appears normal. No evidence of bowel wall thickening, distention, or inflammatory changes. No free intraperitoneal gas or fluid. Vascular/Lymphatic: No significant vascular findings are present. No enlarged abdominal or pelvic lymph nodes. Reproductive: Prostate is unremarkable. Other: A moderate fat containing  umbilical hernia is present containing herniated omental fat. Additionally, there is small fluid within the hernia sac as well as infiltration of the herniated fat suggesting changes of incarceration. The umbilical defect measures 1.9 x 2.0 cm. The hernia sac itself measures 4.3 x 5.4 x 5.8 cm. The rectum is unremarkable. Musculoskeletal: Degenerative changes are seen within the lumbar spine. No acute bone abnormality. IMPRESSION: Moderate fat containing umbilical hernia with fluid within the hernia sac and infiltration of the herniated fat suggesting changes of incarceration. 18 mm indeterminate hyperdense exophytic lesion arising from the interpolar region of the right kidney possibly representing a hyperdense renal cyst. This could be confirmed with dedicated renal sonography. Electronically Signed   By: Fidela Salisbury MD   On: 01/14/2021 03:15     .Adin Hector, M.D., F.A.C.S. Gastrointestinal and Minimally Invasive Surgery [facility] [address] [place], [place] [address] [phone] Main / Paging  01/14/2021 4:50 AM    Adin Hector

## 2021-01-14 NOTE — Op Note (Signed)
01/14/2021  6:43 AM  PATIENT:  Fernando Cook  55 y.o. male  Patient Care Team: Charlott Rakes, MD as PCP - General (Family Medicine) Mauri Pole, MD as Consulting Physician (Gastroenterology) Dorna Leitz, MD as Consulting Physician (Orthopedic Surgery) Michael Boston, MD as Consulting Physician (General Surgery)  PRE-OPERATIVE DIAGNOSIS:  INCARCERATED SUPRAUMBILICAL VENTRAL HERNIA  POST-OPERATIVE DIAGNOSIS:  INCARCERATED SUPRAUMBILICAL VENTRAL HERNIA  PROCEDURE:   REDUCTION AND PRIMARY SUTURE REPAIR OF INCARCERATED HERNIA EXCISION OF HERNIA [place]  SURGEON:  Adin Hector, MD  ASSISTANT: OR Staff   ANESTHESIA:   local and general  Regional nerve block for perioperative & postoperative pain control provided with liposomal bupivacaine (Experel) mixed with 0.25% bupivacaine as a Regional field block x18mL at incision.    EBL:  Total I/O In: 1300 [I.V.:1200; IV Piggyback:100] Out: 15 [Blood:15].  See anesthesia record  Delay start of Pharmacological VTE agent (>24hrs) due to surgical blood loss or risk of bleeding:  no  DRAINS: none   SPECIMEN:  HERNIA SAC & SQ TISSUE  DISPOSITION OF SPECIMEN:  PATHOLOGY  COUNTS:  YES  PLAN OF CARE: Admit to inpatient   PATIENT DISPOSITION:  PACU - hemodynamically stable.  INDICATION: Obese male with known periumbilical hernia for the past 2 years.  Emergency room was diagnosed.  Surgical consultation recommended.  That did not happen.  Works at [employer] does moderately intense physical activity and lifting.  Felt a pop and some pain 2 days ago after doing some lifting.  Lump would not reduce.  Worsening pain.  Came in tonight with painful supraumbilical mass.  Not reducible.  CT scan showing incarcerated hernia with inflammation and cellulitis.  Recommendation made for emergent surgical reduction and repair  The anatomy & physiology of the digestive tract was discussed.  The pathophysiology of perforation was discussed.   Differential diagnosis such as perforated ulcer or colon, etc was discussed.   Natural history risks without surgery such as death was discussed.  I recommended abdominal exploration to diagnose & treat the source of the problem.  Laparoscopic & open techniques were discussed.   Risks such as bleeding, infection, abscess, leak, reoperation, bowel resection, possible ostomy, injury to other organs, need for repair of tissues / organs, hernia, heart attack, death, and other risks were discussed.   The risks of no intervention will lead to serious problems including death.   I expressed a good likelihood that surgery will address the problem.    Goals of post-operative recovery were discussed as well.  We will work to minimize complications although risks in an emergent setting are high.   Questions were answered.  The patient expressed understanding & wishes to proceed with surgery.        OR FINDINGS: Supraumbilical ventral hernia 4 x 4 cm.  Chronically inflamed hernia sac with edema and some early cellulitis.  No frank purulence or necrosis though.  Omentum within viable and reducible.  Primary repair done with #1 PDS running fashion transversely.  CASE DATA:  Type of patient?: LDOW CASE (Surgical Hospitalist [facility] Inpatient)  Status of Case? EMERGENT Add On  Infection Present At Time Of Surgery (PATOS)?  NO  DESCRIPTION:   Informed consent was confirmed.   The patient received IV antibiotics and underwent general anesthesia without any difficulty. The patient was positioned appropriately.  SCDs were active during the entire case.  The abdomen was prepped and draped in a sterile fashion.  Surgical timeout confirmed our plan.    Entry was gained through  a transverse supraumbilical incision over the mass.  Immediately encountered a inflamed hernia sac.  Extend incision, around it.  About the size of a racquetball.  Was able to open up the hernia sac and find omentum that reduced down.  It was  viable.  I excised the hernia sac since it was inflamed with some cellulitis.  Excised some subcutaneous tissues as well.  I explored the abdominal cavity through the hernia and confirmed viable omentum.  Small bowel viable as well.  No purulence.  Could not see nor palpate any abnormal phlegmon area of concern.  Wound was irrigated.  Hemostasis was ensured.  Primarily closed the supraumbilical hernia transversely using #1 PDS running from each corner meeting in the middle.  I had freed the umbilicus off which was somewhat obliterated already.  I rechecked that down using 2-0 Vicryl suture.  I reapproximated the wound with 2-0 Vicryl deep dermal interrupted sutures and running 4 Monocryl suture centrally.  Because there had been some cellulitis in the emergency, I did not feel it was safe to proceed with mesh repair.  I placed umbilical tape through the corners of the closure down to the level of the fascia to allow the wound to drain to minimize seroma/abscess formation.  Sterile dressing applied.  Patient is extubated and is in the recovery room in stable condition.  I made an attempt to locate family to discuss patient's status and recommendations.  I called the patient's sister, Sou Nohr.  No answer.  No one is available at this time.  We will try again later.  Instruction are written    Adin Hector, M.D., F.A.C.S. Gastrointestinal and Minimally Invasive Surgery [facility] [address], [place], [place] [address] [phone] Main / Paging

## 2021-01-14 NOTE — Discharge Instructions (Signed)
[facility] REPAIR: POST OP INSTRUCTIONS  ######################################################################  EAT Gradually transition to a high fiber diet with a fiber supplement over the next few weeks after discharge.  Start with a pureed / full liquid diet (see below)  WALK Walk an hour a day.  Control your pain to do that.    CONTROL PAIN Control pain so that you can walk, sleep, tolerate sneezing/coughing, and go up/down stairs.  HAVE A BOWEL MOVEMENT DAILY Keep your bowels regular to avoid problems.  OK to try a laxative to override constipation.  OK to use an antidairrheal to slow down diarrhea.  Call if not better after 2 tries  CALL IF YOU HAVE PROBLEMS/CONCERNS Call if you are still struggling despite following these instructions. Call if you have concerns not answered by these instructions  ######################################################################    DIET: Follow a light bland diet & liquids the first 24 hours after arrival home, such as soup, liquids, starches, etc.  Be sure to drink plenty of fluids.  Quickly advance to a usual solid diet within a few days.  Avoid fast food or heavy meals as your are more likely to get nauseated or have irregular bowels.  A low-fat, high-fiber diet for the rest of your life is ideal.   Take your usually prescribed home medications unless otherwise directed.  PAIN CONTROL: Pain is best controlled by a usual combination of three different methods TOGETHER: Ice/Heat Over the counter pain medication Prescription pain medication Most patients will experience some swelling and bruising around the hernia(s) such as the bellybutton, groins, or old incisions.  Ice packs or heating pads (30-60 minutes up to 6 times a day) will help. Use ice for the first few days to help decrease swelling and bruising, then switch to heat to help relax tight/sore spots and speed recovery.  Some people prefer to use ice alone, heat alone, alternating  between ice & heat.  Experiment to what works for you.  Swelling and bruising can take several weeks to resolve.   It is helpful to take an over-the-counter pain medication regularly for the first few weeks.  Choose one of the following that works best for you: Naproxen (Aleve, etc)  Two 220mg tabs twice a day Ibuprofen (Advil, etc) Three 200mg tabs four times a day (every meal & bedtime) Acetaminophen (Tylenol, etc) 325-650mg four times a day (every meal & bedtime) A  prescription for pain medication should be given to you upon discharge.  Take your pain medication as prescribed.  If you are having problems/concerns with the prescription medicine (does not control pain, nausea, vomiting, rash, itching, etc), please call us [phone] to see if we need to switch you to a different pain medicine that will work better for you and/or control your side effect better. If you need a refill on your pain medication, please contact your pharmacy.  They will contact our office to request authorization. Prescriptions will not be filled after 5 pm or on week-ends.  Avoid getting constipated.  Between the surgery and the pain medications, it is common to experience some constipation.  Increasing fluid intake and taking a fiber supplement (such as Metamucil, Citrucel, FiberCon, MiraLax, etc) 1-2 times a day regularly will usually help prevent this problem from occurring.  A mild laxative (prune juice, Milk of Magnesia, MiraLax, etc) should be taken according to package directions if there are no bowel movements after 48 hours.    Wash / shower every day.  You may shower over the dressings   as they are waterproof.    Remove your waterproof bandages, skin tapes, and other bandages 3 days after surgery. You may replace a dressing/Band-Aid to cover the incision for comfort if you wish. You may leave the incisions open to air.  You may replace a dressing/Band-Aid to cover an incision for comfort if you wish.  Continue  to shower over incision(s) after the dressing is off.  ACTIVITIES as tolerated:   You may resume regular (light) daily activities beginning the next day--such as daily self-care, walking, climbing stairs--gradually increasing activities as tolerated.  Control your pain so that you can walk an hour a day.  If you can walk 30 minutes without difficulty, it is safe to try more intense activity such as jogging, treadmill, bicycling, low-impact aerobics, swimming, etc. Save the most intensive and strenuous activity for last such as sit-ups, heavy lifting, contact sports, etc  Refrain from any heavy lifting or straining until you are off narcotics for pain control.   DO NOT PUSH THROUGH PAIN.  Let pain be your guide: If it hurts to do something, don't do it.  Pain is your body warning you to avoid that activity for another week until the pain goes down. You may drive when you are no longer taking prescription pain medication, you can comfortably wear a seatbelt, and you can safely maneuver your car and apply brakes. You may have sexual intercourse when it is comfortable.   FOLLOW UP in our office Please call [facility] at [phone] to set up an appointment to see your surgeon in the office for a follow-up appointment approximately 2-3 weeks after your surgery. Make sure that you call for this appointment the day you arrive home to insure a convenient appointment time.  9.  If you have disability of FMLA / Family leave forms, please bring the forms to the office for processing.  (do not give to your surgeon).  WHEN TO CALL US [phone]: Poor pain control Reactions / problems with new medications (rash/itching, nausea, etc)  Fever over 101.5 F (38.5 C) Inability to urinate Nausea and/or vomiting Worsening swelling or bruising Continued bleeding from incision. Increased pain, redness, or drainage from the incision   The clinic staff is available to answer your questions during regular business  hours (8:30am-5pm).  Please don't hesitate to call and ask to speak to one of our nurses for clinical concerns.   If you have a medical emergency, go to the nearest emergency room or call 911.  A surgeon from [facility] is always on call at the hospitals in [place]  [facility] [address], [address], [place], [place]  [address] ?  [address] [address], ,    [address] MAIN: [phone] ? TOLL FREE: [phone] ? FAX: [phone] [email or web address]  

## 2021-01-14 NOTE — Progress Notes (Signed)
Yellow sheet ( security valuable) and earrings in the belonging bag. Pt to OR

## 2021-01-14 NOTE — H&P (Addendum)
Fernando Cook  Apr 25, 1966 [id number]  CARE TEAM:  PCP: Fernando Rakes, MD  Outpatient Care Team: Patient Care Team: Fernando Rakes, MD as PCP - General (Family Medicine) Fernando Pole, MD as Consulting Physician (Gastroenterology)  Inpatient Treatment Team: Treatment Team: Attending Provider: Delora Fuel, MD; Physician Assistant: Fernando Cook; Registered Nurse: Fernando Bien, RN; Technician: Fernando Cook, EMT; Consulting Physician: Fernando Pace, Md, MD   This patient is a 55 y.o.male who presents today for surgical evaluation at the request of Fernando Carnes, PA at [facility] ED.   Chief complaint / Reason for evaluation: Incarcerated hernia  Obese active male.  Works at [employer] with moderately intense lifting.  Has had a hernia known for some time.  Prior evaluation emergency department.  Did not do surgical follow-up since it was not particularly bothering him.  Unfortunately felt worsening pain 2 days ago after active lifting.  Felt a hard knot.  Pain became unbearable.  Came to emergency room tongiht.  Incarcerated hernia suspected.  CT scan shows omentum and not small bowel within it.  Not able to be reduced.  Very painful.  Surgical consultation requested.  Patient denies any history of any abdominal hernia surgery before.  Sounds like he may have had surgery for testicular torsion in the distant past.  He can walk several miles without difficulty.  He does not smoke.  Mildly elevated A1c but no definite diabetes.  He denies any nausea or vomiting.    No personal nor family history of GI/colon cancer, inflammatory bowel disease, irritable bowel syndrome, allergy such as Celiac Sprue, dietary/dairy problems, colitis.  No recent sick contacts/gastroenteritis.  No travel outside the country.  No changes in diet.  No dysphagia to solids or liquids.  No hematochezia, hematemesis, coffee ground emesis.  No evidence of prior gastric/peptic ulceration.  He has been  intermittently compliant following up with her doctor as I believe due to financial issues.  He does struggle with pain and tries to use Celebrex and NSAIDs.  He was evaluated by gastroenterology and found to have esophagitis and a gastric ulcer in 2021.  On Protonix.  Had a colonoscopy as well but it was a poor study due to incomplete bowel prep.  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 male       Problem List:  Principal Problem:   Incarcerated ventral hernia Active Problems:   Hypertension   Obesity (BMI 30-39.9)   Gastric ulcer   Esophagitis determined by endoscopy   History of alcohol abuse   History of noncompliance with medical treatment   Prediabetes   Pair ankle pain and swelling consistent with incarcerated hernia.  Not reducible.  Plan:  Hernia is incarcerated and probably strangulated since its been 48 hours and he is in severe pain.  CAT scan does not show any obvious small bowel within it.  I think this requires emergency surgery.  Since there is no definite small bowel within it can try and do cut down periumbilical incision and excise omentum repair in a primary fashion.  Not safe to do mesh repair in the setting of infection.  Increased risk for hernia recurrence and hopefully he can follow-up with surgery to make sure that does not happen.  The anatomy & physiology of the digestive tract was discussed.  The pathophysiology of perforation was discussed.  Differential diagnosis such as perforated  ulcer or colon, etc was discussed.   Natural history risks without surgery such as death was discussed.  I recommended abdominal exploration to diagnose & treat the source of the problem.  Laparoscopic & open techniques were discussed.   Risks such as bleeding, infection, abscess, leak, reoperation, bowel resection, possible ostomy, injury to other organs, need for repair of tissues / organs,  hernia, heart attack, death, and other risks were discussed.   The risks of no intervention will lead to serious problems including death.   I expressed a good likelihood that surgery will address the problem.    Goals of post-operative recovery were discussed as well.  We will work to minimize complications although risks in an emergent setting are high.   Questions were answered.  The patient expressed understanding & wishes to proceed with surgery.        Continue Protonix for ulcers and esophagitis.  Avoid NSAIDs.    Hypertension.  Elevated blood pressure more related to pain.  Seems in more acceptable range after pain control.  Prediabetes - borderline elevated hemoglobin A1c in the 6s but no major hypoglycemia.  We will consider sliding scale if hyperglycemic peri-/postoperatively  Consider following up with [facility] gastroenterology for repeat colonoscopy as was recommended by Dr. Silverio Cook.    VTE prophylaxis- SCDs, etc  Mobilize as tolerated to help recovery  25 minutes spent in review, evaluation, examination, counseling, and coordination of care.  More than 50% of that time was spent in counseling.  Fernando Hector, MD, FACS, MASCRS Esophageal, Gastrointestinal & Colorectal Surgery Robotic and Minimally Invasive Surgery  [facility] [address], [place], [place] [address] [phone] Fax [phone] Main/Paging  CONTACT INFORMATION: Weekday (9AM-5PM) concerns: Call [facility] main office at [phone] Weeknight (5PM-9AM) or Weekend/Holiday concerns: Check [email or web address] for General Surgery [facility] coverage (Please, do not use SecureChat as it is not reliable communication to operating surgeons for immediate patient care)      01/14/2021      Past Medical History:  Diagnosis Date   Hiatal hernia    Hypertension    Substance abuse ([place])    ETOH/ stopped drinking nov. 2020    Past Surgical History:  Procedure Laterality Date   NO  PAST SURGERIES      Social History   Socioeconomic History   Marital status: Single    Spouse name: Not on file   Number of children: Not on file   Years of education: Not on file   Highest education level: Not on file  Occupational History   Not on file  Tobacco Use   Smoking status: Never   Smokeless tobacco: Never  Vaping Use   Vaping Use: Never used  Substance and Sexual Activity   Alcohol use: Not Currently   Drug use: Not Currently   Sexual activity: Not on file  Other Topics Concern   Not on file  Social History Narrative   Not on file   Social Determinants of Health   Financial Resource Strain: Not on file  Food Insecurity: Not on file  Transportation Needs: Not on file  Physical Activity: Not on file  Stress: Not on file  Social Connections: Not on file  Intimate Partner Violence: Not on file    Family History  Problem Relation Age of Onset   Hypertension Mother    Hyperlipidemia Mother    Prostate cancer Father    Esophageal cancer Neg Hx    Colon cancer Neg Hx  Stomach cancer Neg Hx    Pancreatic cancer Neg Hx     Current Facility-Administered Medications  Medication Dose Route Frequency Provider Last Rate Last Admin   acetaminophen (TYLENOL) tablet 1,000 mg  1,000 mg Oral On Call to OR Michael Boston, MD       cefoTEtan (CEFOTAN) 2 g in sodium chloride 0.9 % 100 mL IVPB  2 g Intravenous On Call to OR Michael Boston, MD       Chlorhexidine Gluconate Cloth 2 % PADS 6 each  6 each Topical Once Michael Boston, MD       sodium chloride (PF) 0.9 % injection            Current Outpatient Medications  Medication Sig Dispense Refill   amLODipine (NORVASC) 5 MG tablet TAKE 1 TABLET (5 MG TOTAL) BY MOUTH DAILY. 90 tablet 3   celecoxib (CELEBREX) 200 MG capsule TAKE 1 CAPSULE (200 MG TOTAL) BY MOUTH DAILY AS NEEDED FOR PAIN 90 capsule 2   losartan-hydrochlorothiazide (HYZAAR) 100-25 MG tablet TAKE 1 TABLET BY MOUTH DAILY. 90 tablet 1   Multiple Vitamin  (ONE-A-DAY MENS PO) Take 1 tablet by mouth daily.     pantoprazole (PROTONIX) 40 MG tablet TAKE 1 TABLET (40 MG TOTAL) BY MOUTH 2 (TWO) TIMES DAILY BEFORE A MEAL. (Patient taking differently: Take 40 mg by mouth daily as needed.) 90 tablet 3   senna-docusate (SENOKOT-S) 8.6-50 MG tablet TAKE 2 TABLETS BY MOUTH DAILY. (Patient taking differently: Take 2 tablets by mouth daily as needed.) 60 tablet 2   traMADol (ULTRAM) 50 MG tablet Take 50 mg by mouth 3 (three) times daily as needed.     celecoxib (CELEBREX) 200 MG capsule TAKE 1 CAPSULE (200 MG TOTAL) BY MOUTH DAILY AS NEEDED FOR PAIN (Patient not taking: Reported on 01/14/2021) 30 capsule 0   celecoxib (CELEBREX) 200 MG capsule TAKE 1 CAPSULE BY MOUTH ONCE DAILY AS NEEDED FOR PAIN (Patient not taking: Reported on 01/14/2021) 30 capsule 0     No Known Allergies  ROS:   All other systems reviewed & are negative except per HPI or as noted below: Constitutional:  No fevers, chills, sweats.  Weight stable Eyes:  No vision changes, No discharge HENT:  No sore throats, nasal drainage Lymph: No neck swelling, No bruising easily Pulmonary:  No cough, productive sputum CV: No orthopnea, PND  Patient walks 60 minutes for about 2 miles without difficulty.  No exertional chest/neck/shoulder/arm pain. GI:  No personal nor family history of GI/colon cancer, inflammatory bowel disease, irritable bowel syndrome, allergy such as Celiac Sprue, dietary/dairy problems, colitis, No recent sick contacts/gastroenteritis.  No travel outside the country.  No changes in diet.  As stated above, positive history for gastric ulcers as well as esophagitis and known hiatal hernia Renal: No UTIs, No hematuria Genital:  No drainage, bleeding, masses.  Recalls having testicular surgery many decades ago. Musculoskeletal: No severe joint pain.  Good ROM major joints Skin:  No sores or lesions.  No rashes Heme/Lymph:  No easy bleeding.  No swollen lymph nodes Neuro: No focal  weakness/numbness.  No seizures Psych: No suicidal ideation.  No hallucinations  BP (!) 130/106   Pulse 65   Temp 99 F (37.2 C) (Oral)   Resp 16   Ht 5\' 7"  (1.702 m)   Wt 104.8 kg   SpO2 96%   BMI 36.18 kg/m   Physical Exam: Constitutional: Trying to sleep on side.  Not toxic nor sickly not cachectic.  Hygeine adequate.  Vitals signs as above.   Eyes: Pupils reactive, normal extraocular movements. Sclera nonicteric Neuro: CN II-XII intact.  No major focal sensory defects.  No major motor deficits. Lymph: No head/neck/groin lymphadenopathy Psych:  No severe agitation.  No severe anxiety.  Judgment & insight Adequate, Oriented x4, HENT: Normocephalic, Mucus membranes moist.  No thrush.   Neck: Supple, No tracheal deviation.  No obvious thyromegaly Chest: No pain to chest wall compression.  Good respiratory excursion.  No audible wheezing CV:  Pulses intact.   Regular rhythm.  No major extremity edema Abdomen: Obese but Soft.  Nondistended.   Tenderness with 8 x 7 cm super umbilical mass very tender with edema.  Not reducible.  Concerning for incarcerated possible strangulated hernia. . No incarcerated hernias.  No hepatomegaly.  No splenomegaly Gen:  No inguinal hernias.  No inguinal lymphadenopathy.   Ext: No obvious deformity or contracture no significant edema.  No cyanosis Skin: No major subcutaneous nodules.  Warm and dry Musculoskeletal: Severe joint rigidity not present.  No obvious clubbing.  No digital petechiae.     Results:   Labs: Results for orders placed or performed during the hospital encounter of 01/13/21 (from the past 48 hour(s))  CBC with Differential     Status: Abnormal   Collection Time: 01/14/21  1:20 AM  Result Value Ref Range   WBC 7.7 4.0 - 10.5 K/uL   RBC 4.65 4.22 - 5.81 MIL/uL   Hemoglobin 12.4 (L) 13.0 - 17.0 g/dL   HCT 39.8 39.0 - 52.0 %   MCV 85.6 80.0 - 100.0 fL   MCH 26.7 26.0 - 34.0 pg   MCHC 31.2 30.0 - 36.0 g/dL   RDW 14.0 11.5 - 15.5  %   Platelets 277 150 - 400 K/uL   nRBC 0.0 0.0 - 0.2 %   Neutrophils Relative % 39 %   Neutro Abs 3.0 1.7 - 7.7 K/uL   Lymphocytes Relative 47 %   Lymphs Abs 3.6 0.7 - 4.0 K/uL   Monocytes Relative 10 %   Monocytes Absolute 0.8 0.1 - 1.0 K/uL   Eosinophils Relative 3 %   Eosinophils Absolute 0.2 0.0 - 0.5 K/uL   Basophils Relative 1 %   Basophils Absolute 0.1 0.0 - 0.1 K/uL   Immature Granulocytes 0 %   Abs Immature Granulocytes 0.02 0.00 - 0.07 K/uL    Comment: Performed at [facility], [place] [address], [place], [place] [address]  Comprehensive metabolic panel     Status: None   Collection Time: 01/14/21  1:20 AM  Result Value Ref Range   Sodium 140 135 - 145 mmol/L   Potassium 3.7 3.5 - 5.1 mmol/L   Chloride 103 98 - 111 mmol/L   CO2 29 22 - 32 mmol/L   Glucose, Bld 96 70 - 99 mg/dL    Comment: Glucose reference range applies only to samples taken after fasting for at least 8 hours.   BUN 13 6 - 20 mg/dL   Creatinine, Ser 0.88 0.61 - 1.24 mg/dL   Calcium 9.3 8.9 - 10.3 mg/dL   Total Protein 7.2 6.5 - 8.1 g/dL   Albumin 3.7 3.5 - 5.0 g/dL   AST 20 15 - 41 U/L   ALT 20 0 - 44 U/L   Alkaline Phosphatase 58 38 - 126 U/L   Total Bilirubin 0.4 0.3 - 1.2 mg/dL   GFR, Estimated >60 >60 mL/min    Comment: (NOTE) Calculated using the CKD-EPI  Creatinine Equation (2021)    Anion gap 8 5 - 15    Comment: Performed at [facility], [place] [address], [place], [place] [address]  Lipase, blood     Status: None   Collection Time: 01/14/21  1:20 AM  Result Value Ref Range   Lipase 26 11 - 51 U/L    Comment: Performed at [facility], [place] [address], [place], [place] [address]  Urinalysis, Routine w reflex microscopic     Status: None   Collection Time: 01/14/21  1:25 AM  Result Value Ref Range   Color, Urine YELLOW YELLOW   APPearance CLEAR CLEAR   Specific Gravity, Urine 1.021 1.005 - 1.030   pH 5.0 5.0 - 8.0   Glucose, UA  NEGATIVE NEGATIVE mg/dL   Hgb urine dipstick NEGATIVE NEGATIVE   Bilirubin Urine NEGATIVE NEGATIVE   Ketones, ur NEGATIVE NEGATIVE mg/dL   Protein, ur NEGATIVE NEGATIVE mg/dL   Nitrite NEGATIVE NEGATIVE   Leukocytes,Ua NEGATIVE NEGATIVE    Comment: Performed at [place] [address], [place], [place] [address]  Resp Panel by RT-PCR (Flu A&B, Covid) Nasopharyngeal Swab     Status: None   Collection Time: 01/14/21  1:25 AM   Specimen: Nasopharyngeal Swab; Nasopharyngeal(NP) swabs in vial transport medium  Result Value Ref Range   SARS Coronavirus 2 by RT PCR NEGATIVE NEGATIVE    Comment: (NOTE) SARS-CoV-2 target nucleic acids are NOT DETECTED.  The SARS-CoV-2 RNA is generally detectable in upper respiratory specimens during the acute phase of infection. The lowest concentration of SARS-CoV-2 viral copies this assay can detect is 138 copies/mL. A negative result does not preclude SARS-Cov-2 infection and should not be used as the sole basis for treatment or other patient management decisions. A negative result may occur with  improper specimen collection/handling, submission of specimen other than nasopharyngeal swab, presence of viral mutation(s) within the areas targeted by this assay, and inadequate number of viral copies(<138 copies/mL). A negative result must be combined with clinical observations, patient history, and epidemiological information. The expected result is Negative.  Fact Sheet for Patients:  EntrepreneurPulse.com.au  Fact Sheet for Healthcare Providers:  IncredibleEmployment.be  This test is no t yet approved or cleared by the [place] FDA and  has been authorized for detection and/or diagnosis of SARS-CoV-2 by FDA under an Emergency Use Authorization (EUA). This EUA will remain  in effect (meaning this test can be used) for the duration of the COVID-19 declaration under Section 564(b)(1) of the  Act, 21 U.S.C.section 360bbb-3(b)(1), unless the authorization is terminated  or revoked sooner.       Influenza A by PCR NEGATIVE NEGATIVE   Influenza B by PCR NEGATIVE NEGATIVE    Comment: (NOTE) The Xpert Xpress SARS-CoV-2/FLU/RSV plus assay is intended as an aid in the diagnosis of influenza from Nasopharyngeal swab specimens and should not be used as a sole basis for treatment. Nasal washings and aspirates are unacceptable for Xpert Xpress SARS-CoV-2/FLU/RSV testing.  Fact Sheet for Patients: EntrepreneurPulse.com.au  Fact Sheet for Healthcare Providers: IncredibleEmployment.be  This test is not yet approved or cleared by the [place] FDA and has been authorized for detection and/or diagnosis of SARS-CoV-2 by FDA under an Emergency Use Authorization (EUA). This EUA will remain in effect (meaning this test can be used) for the duration of the COVID-19 declaration under Section 564(b)(1) of the Act, 21 U.S.C. section 360bbb-3(b)(1), unless the authorization is terminated or revoked.  Performed at  [facility], [place] [address] [place], [place] [address]     Imaging / Studies: CT ABDOMEN PELVIS W CONTRAST  Result Date: 01/14/2021 CLINICAL DATA:  Abdominal pain, umbilical hernia EXAM: CT ABDOMEN AND PELVIS WITH CONTRAST TECHNIQUE: Multidetector CT imaging of the abdomen and pelvis was performed using the standard protocol following bolus administration of intravenous contrast. CONTRAST:  144mL OMNIPAQUE IOHEXOL 300 MG/ML  SOLN COMPARISON:  None. FINDINGS: Lower chest: The visualized lung bases are clear bilaterally. The visualized heart and pericardium are unremarkable. Hepatobiliary: No focal liver abnormality is seen. No gallstones, gallbladder wall thickening, or biliary dilatation. Pancreas: Unremarkable Spleen: Unremarkable Adrenals/Urinary Tract: The adrenal glands are unremarkable. The kidneys are normal in size  and position. Bilateral simple cortical cysts are identified. A 19 mm exophytic low-attenuation lesion arises from the interpolar region of the right kidney possibly representing a hyperdense renal cyst, though this is not optimally characterized on this single-phase examination. The kidneys are otherwise unremarkable. The bladder is unremarkable. Stomach/Bowel: Stomach is within normal limits. Appendix appears normal. No evidence of bowel wall thickening, distention, or inflammatory changes. No free intraperitoneal gas or fluid. Vascular/Lymphatic: No significant vascular findings are present. No enlarged abdominal or pelvic lymph nodes. Reproductive: Prostate is unremarkable. Other: A moderate fat containing umbilical hernia is present containing herniated omental fat. Additionally, there is small fluid within the hernia sac as well as infiltration of the herniated fat suggesting changes of incarceration. The umbilical defect measures 1.9 x 2.0 cm. The hernia sac itself measures 4.3 x 5.4 x 5.8 cm. The rectum is unremarkable. Musculoskeletal: Degenerative changes are seen within the lumbar spine. No acute bone abnormality. IMPRESSION: Moderate fat containing umbilical hernia with fluid within the hernia sac and infiltration of the herniated fat suggesting changes of incarceration. 18 mm indeterminate hyperdense exophytic lesion arising from the interpolar region of the right kidney possibly representing a hyperdense renal cyst. This could be confirmed with dedicated renal sonography. Electronically Signed   By: Fidela [place] MD   On: 01/14/2021 03:15    Medications / Allergies: per chart  Antibiotics: Anti-infectives (From admission, onward)    Start     Dose/Rate Route Frequency Ordered Stop   01/14/21 0600  cefoTEtan (CEFOTAN) 2 g in sodium chloride 0.9 % 100 mL IVPB        2 g 200 mL/hr over 30 Minutes Intravenous On call to O.R. 01/14/21 0407 01/15/21 0559         Note: Portions of this  report may have been transcribed using voice recognition software. Every effort was made to ensure accuracy; however, inadvertent computerized transcription errors may be present.   Any transcriptional errors that result from this process are unintentional.    Fernando Hector, MD, FACS, MASCRS Esophageal, Gastrointestinal & Colorectal Surgery Robotic and Minimally Invasive Surgery  [facility] [address], [place], [place] [address] [phone] Fax [phone] Main/Paging  CONTACT INFORMATION: Weekday (9AM-5PM) concerns: Call [facility] main office at [phone] Weeknight (5PM-9AM) or Weekend/Holiday concerns: Check [email or web address] for General Surgery [facility] coverage (Please, do not use SecureChat as it is not reliable communication to operating surgeons for immediate patient care)      01/14/2021  4:08 AM

## 2021-01-14 NOTE — ED Notes (Signed)
Patient transported to CT 

## 2021-01-15 ENCOUNTER — Other Ambulatory Visit: Payer: Self-pay

## 2021-01-15 ENCOUNTER — Encounter (HOSPITAL_COMMUNITY): Payer: Self-pay | Admitting: Surgery

## 2021-01-15 LAB — SURGICAL PATHOLOGY

## 2021-01-15 MED ORDER — DOCUSATE SODIUM 100 MG PO CAPS: 100.0000 mg | ORAL_CAPSULE | Freq: Two times a day (BID) | ORAL | 2 refills | Status: AC | PRN

## 2021-01-15 MED ORDER — AMOXICILLIN-POT CLAVULANATE 875-125 MG PO TABS: 1.0000 | ORAL_TABLET | Freq: Two times a day (BID) | ORAL | 0 refills | Status: AC

## 2021-01-15 MED ORDER — TRAMADOL HCL 50 MG PO TABS: 50.0000 mg | ORAL_TABLET | Freq: Four times a day (QID) | ORAL | 0 refills | Status: AC | PRN

## 2021-01-15 MED ORDER — POLYETHYLENE GLYCOL 3350 17 G PO PACK: 17.0000 g | PACK | Freq: Every day | ORAL | 0 refills | Status: AC | PRN

## 2021-01-15 MED ORDER — ACETAMINOPHEN 500 MG PO TABS: 1000.0000 mg | ORAL_TABLET | Freq: Three times a day (TID) | ORAL | 0 refills | Status: AC | PRN

## 2021-01-15 NOTE — Progress Notes (Signed)
Pt alert and oriented, tolerating diet. D/C instructions given, pt d/cd home. 

## 2021-01-15 NOTE — Discharge Summary (Signed)
Patient ID: Fernando Cook [id number] November 17, 1965 55 y.o.  Admit date: 01/13/2021 Discharge date: 01/15/2021  Admitting Diagnosis: Incarcerated supraumbilical ventral hernia   Discharge Diagnosis Incarcerated supraumbilical ventral hernia   Consultants None   Reason for Admission: Obese active male.  Works at [employer] with moderately intense lifting.  Has had a hernia known for some time.  Prior evaluation emergency department.  Did not do surgical follow-up since it was not particularly bothering him.  Unfortunately felt worsening pain 2 days ago after active lifting.  Felt a hard knot.  Pain became unbearable.  Came to emergency room tongiht.  Incarcerated hernia suspected.  CT scan shows omentum and not small bowel within it.  Not able to be reduced.  Very painful.  Surgical consultation requested.  Patient denies any history of any abdominal hernia surgery before.  Sounds like he may have had surgery for testicular torsion in the distant past.  He can walk several miles without difficulty.  He does not smoke.  Mildly elevated A1c but no definite diabetes.  He denies any nausea or vomiting.     No personal nor family history of GI/colon cancer, inflammatory bowel disease, irritable bowel syndrome, allergy such as Celiac Sprue, dietary/dairy problems, colitis.  No recent sick contacts/gastroenteritis.  No travel outside the country.  No changes in diet.  No dysphagia to solids or liquids.  No hematochezia, hematemesis, coffee ground emesis.  No evidence of prior gastric/peptic ulceration.  He has been intermittently compliant following up with her doctor as I believe due to financial issues.  He does struggle with pain and tries to use Celebrex and NSAIDs.  He was evaluated by gastroenterology and found to have esophagitis and a gastric ulcer in 2021.  On Protonix.  Had a colonoscopy as well but it was a poor study due to incomplete bowel prep.  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 Johney Maine - reduction and primary suture repair of Incarcerated hernia, excision of hernia sac - 01/14/21  Hospital Course:  Patient presented as above. He underwent reduction and primary suture repair of Incarcerated hernia, excision of hernia sac for incarcerated supraumbilical ventral hernia by Dr. Johney Maine on 6/27. Patient tolerated the procedure well. He was transferred to the floor post op. His diet was advanced and tolerated. On POD 1 the patient was voiding well, tolerating diet, ambulating well, pain well controlled, vital signs stable, incisions c/d/I.   Discussed with Dr. Johney Maine. Okay for d/c on 5days Augmentin and arranged for follow up in the office POD 3 for wound check and wick removal. A work note was provided  Follow up as noted below. Discharge and return precautions discussed.  Physical Exam: Gen:  Alert, NAD, pleasant HEENT: EOM's intact, pupils equal and round Card:  RRR Pulm:  CTAB, no W/R/R, effort normal Abd: Soft, ND, appropriately tender without peritonitis.  Normoactive bowel sounds.  Tegaderm and gauze dressing in place, c/d/i Ext:  No LE edema  Psych: A&Ox3  Skin: no rashes noted, warm and dry   Allergies as of 01/15/2021       Reactions   Nsaids Other (See Comments)   Gastric ulcers        Medication List     STOP taking these medications    Senna Plus 8.6-50 MG tablet Generic drug: senna-docusate       TAKE these medications    acetaminophen 500 MG tablet Commonly known as: TYLENOL  Take 2 tablets (1,000 mg total) by mouth every 8 (eight) hours as needed.   amLODipine 5 MG tablet Commonly known as: NORVASC TAKE 1 TABLET (5 MG TOTAL) BY MOUTH DAILY.   amoxicillin-clavulanate 875-125 MG tablet Commonly known as: AUGMENTIN Take 1 tablet by mouth every 12 (twelve) hours.   docusate sodium 100 MG capsule Commonly known as: Colace Take 1 capsule (100 mg total) by mouth 2 (two) times daily as  needed for mild constipation.   losartan-hydrochlorothiazide 100-25 MG tablet Commonly known as: HYZAAR TAKE 1 TABLET BY MOUTH DAILY.   ONE-A-DAY MENS PO Take 1 tablet by mouth daily.   pantoprazole 40 MG tablet Commonly known as: PROTONIX Take 1 tablet (40 mg total) by mouth daily. What changed:  how much to take when to take this   polyethylene glycol 17 g packet Commonly known as: MiraLax Take 17 g by mouth daily as needed for mild constipation.   traMADol 50 MG tablet Commonly known as: Ultram Take 1 tablet (50 mg total) by mouth every 6 (six) hours as needed (breakthrough pain). What changed:  when to take this reasons to take this               Discharge Care Instructions  (From admission, onward)           Start     Ordered   01/14/21 0000  Discharge wound care:       Comments: It is good for closed incisions and even open wounds to be washed every day.  Shower every day.  Short baths are fine.  Wash the incisions and wounds clean with soap & water.    You may leave closed incisions open to air if it is dry.   You may cover the incision with clean gauze & replace it after your daily shower for comfort.  TEGADERM:  You have clear gauze band-aid dressings over your closed incision(s).  Remove the dressing & cloth shoelace wicks 3 days after surgery = 01/17/2021   01/14/21 9628              Follow-up [facility] Follow up on 01/17/2021.   Specialty: General Surgery Why: 9am for wound check and wick removal. this is a nurse visit. Please bring a copy of your photo ID and insurance card. Please arrive 30 minutes prior to your appointment for paperwork. Contact information: [place] [phone]        Tyese Finken [place], MD Follow up on 02/20/2021.   Specialties: General Surgery, Colon and Rectal Surgery Why: at 430pm. Please bring a copy of your photo ID and  insurance card. Please arrive 30 minutes prior to your appointment for paperwork. Contact information: [address] [place] [address] [phone]                 Signed: Alferd Apa, [facility] 01/15/2021, 11:26 AM Please see Amion for pager number during day hours 7:00am-4:30pm

## 2021-01-16 ENCOUNTER — Telehealth: Payer: Self-pay

## 2021-01-16 NOTE — Telephone Encounter (Signed)
Transition Care Management Follow-up Telephone Call Date of discharge and from where: 01/15/2021, [facility] How have you been since you were released from the hospital? He stated he is okay, just sore.  Any questions or concerns? Yes - he had questions about bathing and surgical site care after the dressing is removed. Instructions on AVS reviewed.   Items Reviewed: Did the pt receive and understand the discharge instructions provided? Yes  Medications obtained and verified? Yes  - he said he has all medications and did not have any questions about the med regime.  Other? No  Any new allergies since your discharge? No  Do you have support at home? Yes   Home Care and Equipment/Supplies: Were home health services ordered? no If so, what is the name of the agency? N/a  Has the agency set up a time to come to the patient's home? not applicable Were any new equipment or medical supplies ordered?  No What is the name of the medical supply agency? N/a Were you able to get the supplies/equipment? not applicable Do you have any questions related to the use of the equipment or supplies? No  Functional Questionnaire: (I = Independent and D = Dependent) ADLs: independent.with personal care.  Has been using a cane with ambulation . Dressing remains intact until tomorrow - 01/17/2021 when he has an appointment with the surgeon.   Follow up appointments reviewed:  PCP Hospital f/u appt confirmed? Yes  Scheduled to see Epimenio Sarin, PA  on 01/30/2021 Specialist Hospital f/u appt confirmed? Yes  Scheduled to see surgeon  on 01/17/2021 Are transportation arrangements needed? No  If their condition worsens, is the pt aware to call PCP or go to the Emergency Dept.? Yes Was the patient provided with contact information for the PCP's office or ED? Yes Was to pt encouraged to call back with questions or concerns? Yes

## 2021-01-30 ENCOUNTER — Ambulatory Visit: Payer: 59 | Admitting: Physician Assistant

## 2021-01-30 NOTE — Progress Notes (Deleted)
Patient ID: Fernando Cook, male   DOB: 04-27-1966, 55 y.o.   MRN: [id number]   After hospitalization for hernia repair 6/26-6/28/2022  From discharge summary: Admit date: 01/13/2021 Discharge date: 01/15/2021   Admitting Diagnosis: Incarcerated supraumbilical ventral hernia    Discharge Diagnosis Incarcerated supraumbilical ventral hernia    Consultants None    Reason for Admission: Obese active male.  Works at [employer] with moderately intense lifting.  Has had a hernia known for some time.  Prior evaluation emergency department.  Did not do surgical follow-up since it was not particularly bothering him.  Unfortunately felt worsening pain 2 days ago after active lifting.  Felt a hard knot.  Pain became unbearable.  Came to emergency room tongiht.  Incarcerated hernia suspected.  CT scan shows omentum and not small bowel within it.  Not able to be reduced.  Very painful.  Surgical consultation requested.  Patient denies any history of any abdominal hernia surgery before.  Sounds like he may have had surgery for testicular torsion in the distant past.  He can walk several miles without difficulty.  He does not smoke.  Mildly elevated A1c but no definite diabetes.  He denies any nausea or vomiting.     No personal nor family history of GI/colon cancer, inflammatory bowel disease, irritable bowel syndrome, allergy such as Celiac Sprue, dietary/dairy problems, colitis.  No recent sick contacts/gastroenteritis.  No travel outside the country.  No changes in diet.  No dysphagia to solids or liquids.  No hematochezia, hematemesis, coffee ground emesis.  No evidence of prior gastric/peptic ulceration.  He has been intermittently compliant following up with her doctor as I believe due to financial issues.  He does struggle with pain and tries to use Celebrex and NSAIDs.  He was evaluated by gastroenterology and found to have esophagitis and a gastric ulcer in 2021.  On Protonix.  Had a colonoscopy as well  but it was a poor study due to incomplete bowel prep.  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 Johney Maine - reduction and primary suture repair of Incarcerated hernia, excision of hernia sac - 01/14/21   Hospital Course:  Patient presented as above. He underwent reduction and primary suture repair of Incarcerated hernia, excision of hernia sac for incarcerated supraumbilical ventral hernia by Dr. Johney Maine on 6/27. Patient tolerated the procedure well. He was transferred to the floor post op. His diet was advanced and tolerated. On POD 1 the patient was voiding well, tolerating diet, ambulating well, pain well controlled, vital signs stable, incisions c/d/I.    Discussed with Dr. Johney Maine. Okay for d/c on 5days Augmentin and arranged for follow up in the office POD 3 for wound check and wick removal. A work note was provided

## 2021-03-08 ENCOUNTER — Other Ambulatory Visit: Payer: Self-pay | Admitting: Physician Assistant

## 2021-03-08 DIAGNOSIS — I10 Essential (primary) hypertension: Secondary | ICD-10-CM

## 2021-03-13 ENCOUNTER — Other Ambulatory Visit (HOSPITAL_COMMUNITY): Payer: Self-pay

## 2021-03-13 ENCOUNTER — Other Ambulatory Visit: Payer: Self-pay

## 2022-12-10 IMAGING — CT CT ABD-PELV W/ CM
2 of 4 series · 16 of 46 positions shown, 18 images · IV contrast (OMNIPAQUE 300)
Comparison: None.

CLINICAL DATA: Abdominal pain, umbilical hernia

EXAM:
CT ABDOMEN AND PELVIS WITH CONTRAST
TECHNIQUE: Multidetector CT imaging of the abdomen and pelvis was performed
using the standard protocol following bolus administration of
intravenous contrast.
CONTRAST:  100mL OMNIPAQUE IOHEXOL 300 MG/ML  SOLN

[Series 2: axial st · axial · 0.89mm/px · z∈[+790,+1220]mm · 13 of 94 slices shown, 15 images]
[im 4/94  soft-tissue]
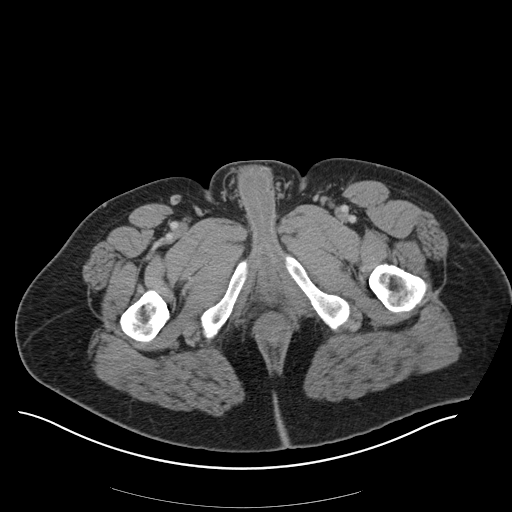
[im 4/94  bone]
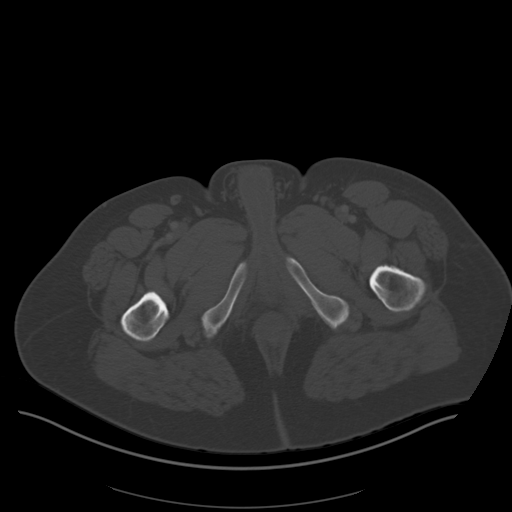
[im 12/94  soft-tissue]
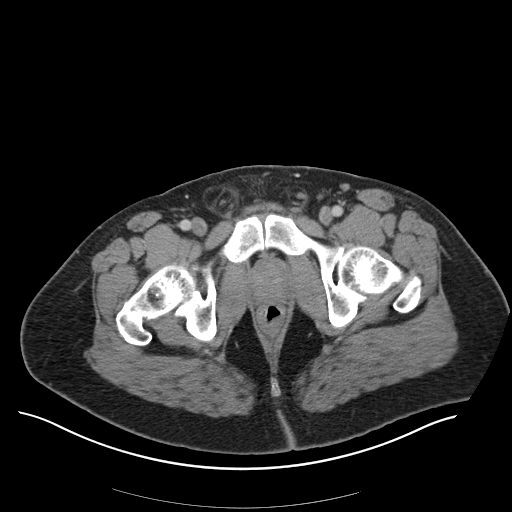
[im 20/94  soft-tissue]
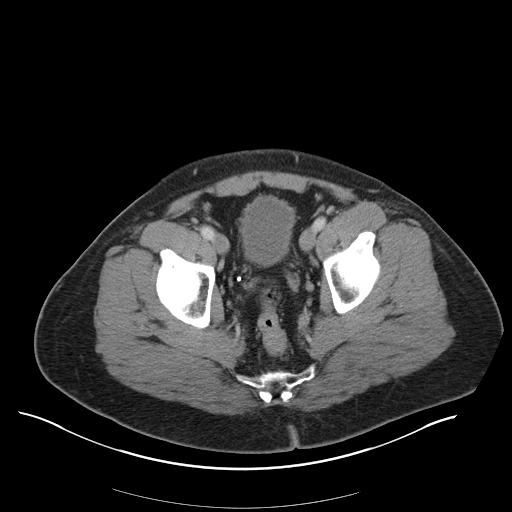
[im 28/94  soft-tissue]
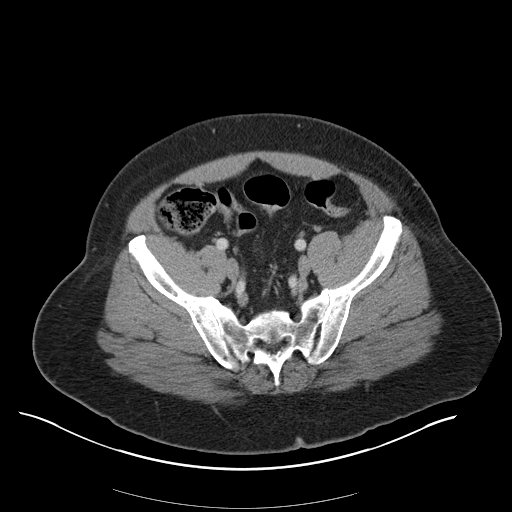
[im 32/94  soft-tissue]
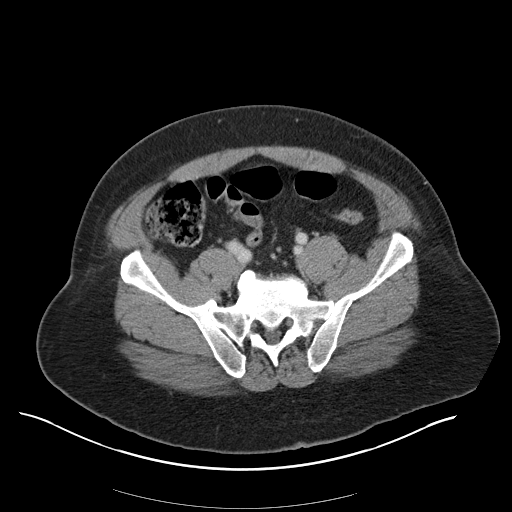
[im 39/94  soft-tissue]
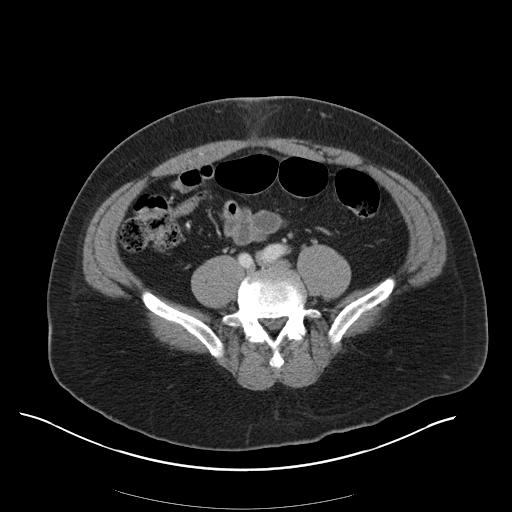
[im 47/94  soft-tissue]
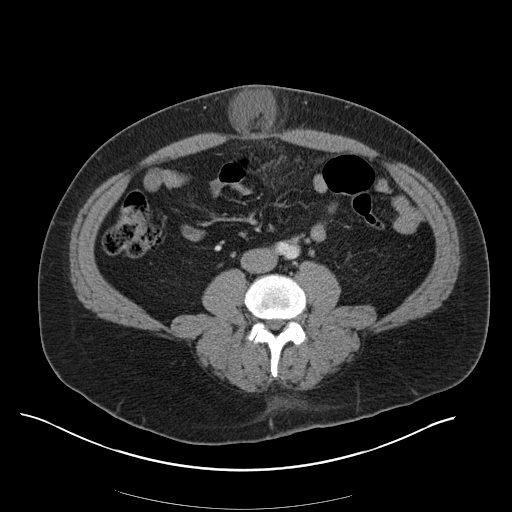
[im 55/94  soft-tissue]
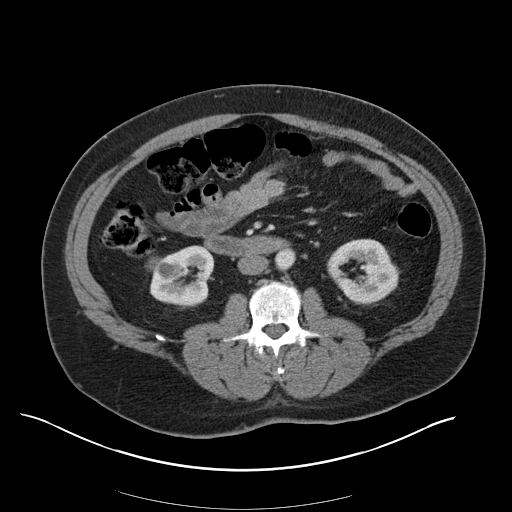
[im 63/94  soft-tissue]
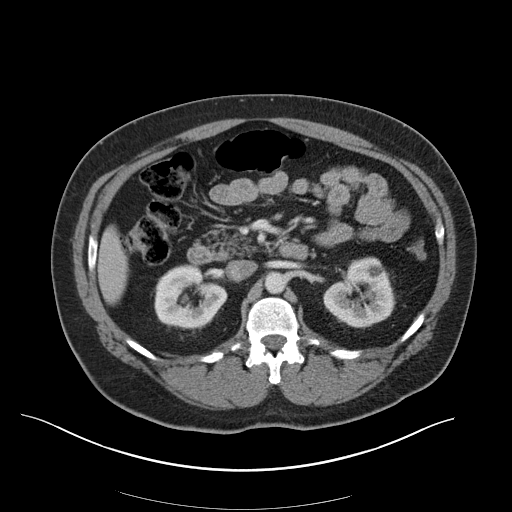
[im 63/94  bone]
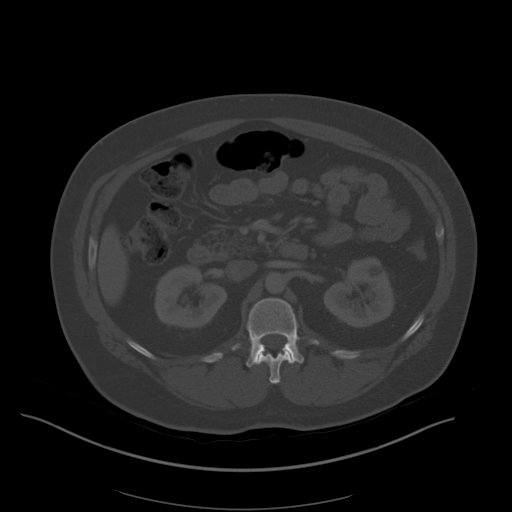
[im 66/94  soft-tissue]
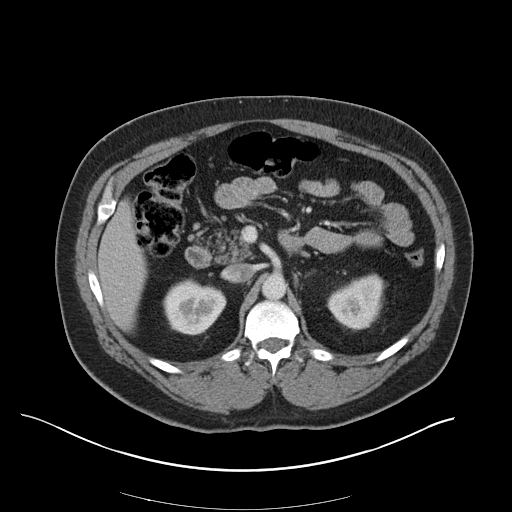
[im 74/94  soft-tissue]
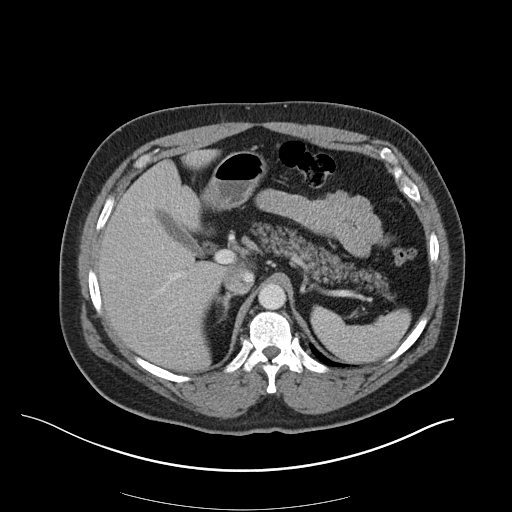
[im 82/94  soft-tissue]
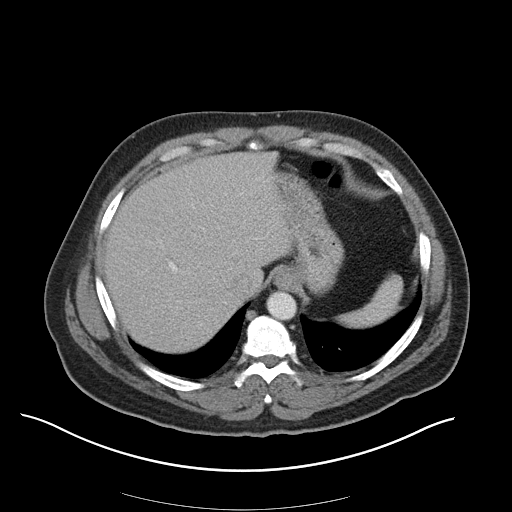
[im 90/94  soft-tissue]
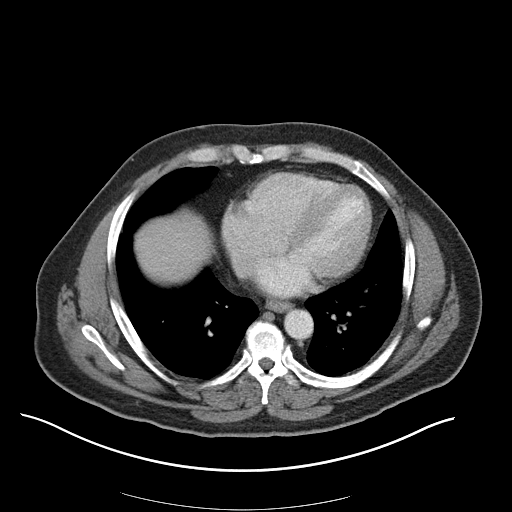

[Series 4: coronal st · coronal · 0.89mm/px · 3 of 166 slices shown]
[im 56/166  soft-tissue]
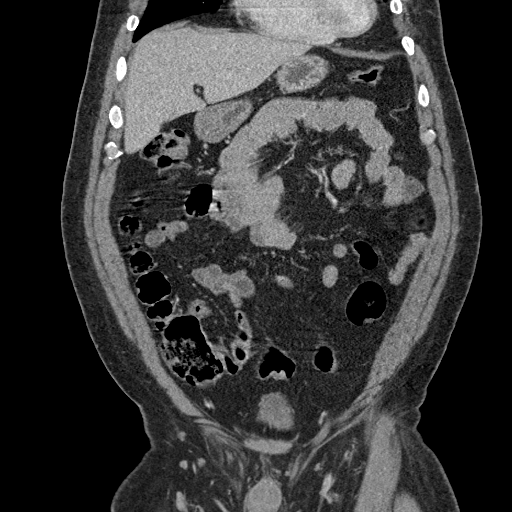
[im 74/166  soft-tissue]
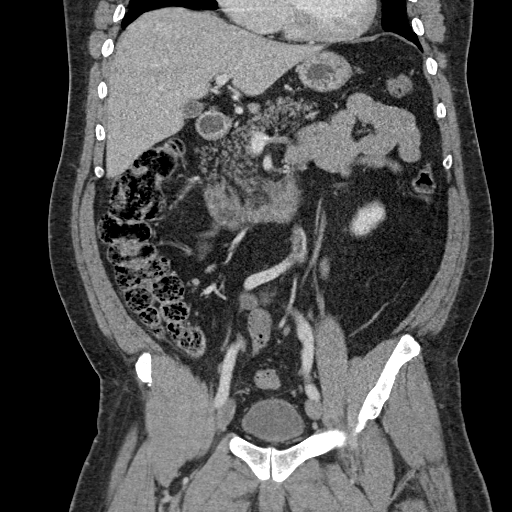
[im 92/166  soft-tissue]
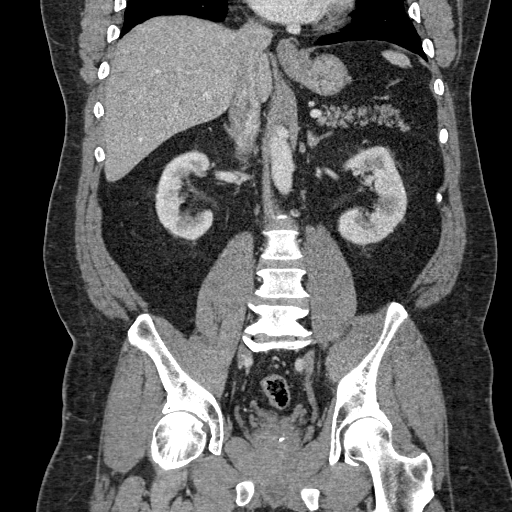

[16 of 46 positions shown; findings below may reference images not displayed]

FINDINGS: Lower chest: The visualized lung bases are clear bilaterally. The
visualized heart and pericardium are unremarkable.

Hepatobiliary: No focal liver abnormality is seen. No gallstones,
gallbladder wall thickening, or biliary dilatation.

Pancreas: Unremarkable

Spleen: Unremarkable

Adrenals/Urinary Tract: The adrenal glands are unremarkable. The
kidneys are normal in size and position. Bilateral simple cortical
cysts are identified. A 19 mm exophytic low-attenuation lesion
arises from the interpolar region of the right kidney possibly
representing a hyperdense renal cyst, though this is not optimally
characterized on this single-phase examination. The kidneys are
otherwise unremarkable. The bladder is unremarkable.

Stomach/Bowel: Stomach is within normal limits. Appendix appears
normal. No evidence of bowel wall thickening, distention, or
inflammatory changes. No free intraperitoneal gas or fluid.

Vascular/Lymphatic: No significant vascular findings are present. No
enlarged abdominal or pelvic lymph nodes.

Reproductive: Prostate is unremarkable.

Other: A moderate fat containing umbilical hernia is present
containing herniated omental fat. Additionally, there is small fluid
within the hernia sac as well as infiltration of the herniated fat
suggesting changes of incarceration. The umbilical defect measures
1.9 x 2.0 cm. The hernia sac itself measures 4.3 x 5.4 x 5.8 cm. The
rectum is unremarkable.

Musculoskeletal: Degenerative changes are seen within the lumbar
spine. No acute bone abnormality.
IMPRESSION: Moderate fat containing umbilical hernia with fluid within the
hernia sac and infiltration of the herniated fat suggesting changes
of incarceration.

18 mm indeterminate hyperdense exophytic lesion arising from the
interpolar region of the right kidney possibly representing a
hyperdense renal cyst. This could be confirmed with dedicated renal
sonography.
